# Patient Record
Sex: Female | Born: 1965
Health system: Southern US, Community
[De-identification: ages and names within clinical notes are randomized; demographics above are authoritative.]

## PROBLEM LIST (undated history)

## (undated) DIAGNOSIS — M7061 Trochanteric bursitis, right hip: Secondary | ICD-10-CM

## (undated) DIAGNOSIS — M7062 Trochanteric bursitis, left hip: Secondary | ICD-10-CM

## (undated) DIAGNOSIS — E079 Disorder of thyroid, unspecified: Secondary | ICD-10-CM

## (undated) DIAGNOSIS — M76822 Posterior tibial tendinitis, left leg: Secondary | ICD-10-CM

## (undated) DIAGNOSIS — M722 Plantar fascial fibromatosis: Secondary | ICD-10-CM

## (undated) HISTORY — DX: Disorder of thyroid, unspecified: E07.9

## (undated) HISTORY — DX: Plantar fascial fibromatosis: M72.2

## (undated) HISTORY — DX: Posterior tibial tendinitis, left leg: M76.822

## (undated) HISTORY — PX: APPENDECTOMY: SHX54

## (undated) HISTORY — DX: Trochanteric bursitis, left hip: M70.62

## (undated) HISTORY — DX: Trochanteric bursitis, right hip: M70.61

---

## 2001-12-23 ENCOUNTER — Emergency Department (HOSPITAL_COMMUNITY): Admission: EM | Admit: 2001-12-23 | Discharge: 2001-12-23 | Payer: Self-pay | Admitting: Emergency Medicine

## 2004-12-16 ENCOUNTER — Emergency Department (HOSPITAL_COMMUNITY): Admission: EM | Admit: 2004-12-16 | Discharge: 2004-12-16 | Payer: Self-pay | Admitting: Emergency Medicine

## 2012-12-18 DIAGNOSIS — R5381 Other malaise: Secondary | ICD-10-CM | POA: Insufficient documentation

## 2016-09-02 LAB — HM PAP SMEAR

## 2016-12-16 DIAGNOSIS — H6983 Other specified disorders of Eustachian tube, bilateral: Secondary | ICD-10-CM | POA: Insufficient documentation

## 2017-08-02 ENCOUNTER — Encounter: Payer: Self-pay | Admitting: Sports Medicine

## 2017-08-02 ENCOUNTER — Ambulatory Visit (INDEPENDENT_AMBULATORY_CARE_PROVIDER_SITE_OTHER): Payer: Self-pay | Admitting: Sports Medicine

## 2017-08-02 DIAGNOSIS — M722 Plantar fascial fibromatosis: Secondary | ICD-10-CM | POA: Insufficient documentation

## 2017-08-02 DIAGNOSIS — E669 Obesity, unspecified: Secondary | ICD-10-CM

## 2017-08-02 DIAGNOSIS — M7061 Trochanteric bursitis, right hip: Secondary | ICD-10-CM

## 2017-08-02 DIAGNOSIS — M7062 Trochanteric bursitis, left hip: Secondary | ICD-10-CM

## 2017-08-02 DIAGNOSIS — M76822 Posterior tibial tendinitis, left leg: Secondary | ICD-10-CM

## 2017-08-02 HISTORY — DX: Plantar fascial fibromatosis: M72.2

## 2017-08-02 HISTORY — DX: Posterior tibial tendinitis, left leg: M76.822

## 2017-08-02 HISTORY — DX: Trochanteric bursitis, left hip: M70.62

## 2017-08-02 HISTORY — DX: Trochanteric bursitis, right hip: M70.61

## 2017-08-02 MED ORDER — MELOXICAM 15 MG PO TABS: ORAL_TABLET | ORAL | 3 refills | Status: DC

## 2017-08-02 NOTE — Assessment & Plan Note (Signed)
Discuss weight loss medication with PCP 

## 2017-08-02 NOTE — Progress Notes (Signed)
Subjective:    I'm seeing this patient as a consultation for: Hayden Rasmussenonna Judd, PA-C  CC: Hip pain, foot pain  HPI: Tiffany Best is a pleasant 52 year old female, she comes in with a long history of pain in both hips, left worse than right, lateral aspect.  Worse when laying on the ipsilateral side.  She also has plantar fasciitis diagnosed by another provider, left-sided, as well as tibialis posterior tendinopathy.  Symptoms are moderate, persistent.  I reviewed the past medical history, family history, social history, surgical history, and allergies today and no changes were needed.  Please see the problem list section below in epic for further details.  Past Medical History: No past medical history on file. Past Surgical History: History reviewed. No pertinent surgical history. Social History: Social History   Socioeconomic History  . Marital status: Single    Spouse name: None  . Number of children: None  . Years of education: None  . Highest education level: None  Social Needs  . Financial resource strain: None  . Food insecurity - worry: None  . Food insecurity - inability: None  . Transportation needs - medical: None  . Transportation needs - non-medical: None  Occupational History  . None  Tobacco Use  . Smoking status: None  Substance and Sexual Activity  . Alcohol use: None  . Drug use: None  . Sexual activity: None  Other Topics Concern  . None  Social History Narrative  . None   Family History: No family history on file. Allergies: Allergies  Allergen Reactions  . Penicillins Hives and Rash   Medications: See med rec.  Review of Systems: No headache, visual changes, nausea, vomiting, diarrhea, constipation, dizziness, abdominal pain, skin rash, fevers, chills, night sweats, weight loss, swollen lymph nodes, body aches, joint swelling, muscle aches, chest pain, shortness of breath, mood changes, visual or auditory hallucinations.   Objective:   General: Well  Developed, well nourished, and in no acute distress.  Neuro:  Extra-ocular muscles intact, able to move all 4 extremities, sensation grossly intact.  Deep tendon reflexes tested were normal. Psych: Alert and oriented, mood congruent with affect. ENT:  Ears and nose appear unremarkable.  Hearing grossly normal. Neck: Unremarkable overall appearance, trachea midline.  No visible thyroid enlargement. Eyes: Conjunctivae and lids appear unremarkable.  Pupils equal and round. Skin: Warm and dry, no rashes noted.  Cardiovascular: Pulses palpable, no extremity edema. Left hip: ROM IR: 60 Deg, ER: 60 Deg, Flexion: 120 Deg, Extension: 100 Deg, Abduction: 45 Deg, Adduction: 45 Deg Strength IR: 5/5, ER: 5/5, Flexion: 5/5, Extension: 5/5, Abduction: 5/5, Adduction: 5/5 Pelvic alignment unremarkable to inspection and palpation. Standing hip rotation and gait without trendelenburg / unsteadiness. Greater trochanter with tenderness to palpation. Hip abductor's are weak. No tenderness over piriformis. No SI joint tenderness and normal minimal SI movement.  Impression and Recommendations:   This case required medical decision making of moderate complexity.  Tibialis posterior tendinitis, left Return for custom orthotics, rehab exercises given, meloxicam.  Trochanteric bursitis of both hips Aggressive hip abductor rehabilitation. I would like her to do a single session of formal PT, and then do these exercises at home. Return to see me in a month, injection if no better.  Plantar fasciitis, left Return for custom molded orthotics, additional rehab exercises given. Injection if no better in 1 month.  Obesity (BMI 30-39.9) Discuss weight loss medication with PCP. ___________________________________________ Ihor Austinhomas J. Benjamin Stainhekkekandam, M.D., ABFM., CAQSM. Primary Care and Sports Medicine [facility]  Adjunct Instructor of Family Medicine  [employer]

## 2017-08-02 NOTE — Assessment & Plan Note (Signed)
Return for custom orthotics, rehab exercises given, meloxicam.

## 2017-08-02 NOTE — Assessment & Plan Note (Signed)
Return for custom molded orthotics, additional rehab exercises given. Injection if no better in 1 month.

## 2017-08-02 NOTE — Assessment & Plan Note (Signed)
Aggressive hip abductor rehabilitation. I would like her to do a single session of formal PT, and then do these exercises at home. Return to see me in a month, injection if no better.

## 2017-08-09 ENCOUNTER — Ambulatory Visit: Payer: Self-pay | Admitting: Physician Assistant

## 2017-08-11 ENCOUNTER — Encounter: Payer: Self-pay | Admitting: Sports Medicine

## 2017-09-04 ENCOUNTER — Encounter: Payer: Self-pay | Admitting: Sports Medicine

## 2017-09-14 ENCOUNTER — Ambulatory Visit (INDEPENDENT_AMBULATORY_CARE_PROVIDER_SITE_OTHER): Payer: BLUE CROSS/BLUE SHIELD | Admitting: Sports Medicine

## 2017-09-14 ENCOUNTER — Encounter: Payer: Self-pay | Admitting: Physician Assistant

## 2017-09-14 ENCOUNTER — Ambulatory Visit (INDEPENDENT_AMBULATORY_CARE_PROVIDER_SITE_OTHER): Payer: BLUE CROSS/BLUE SHIELD | Admitting: Physician Assistant

## 2017-09-14 VITALS — BP 140/99 | HR 93 | Ht 60.0 in | Wt 161.0 lb

## 2017-09-14 DIAGNOSIS — R030 Elevated blood-pressure reading, without diagnosis of hypertension: Secondary | ICD-10-CM | POA: Diagnosis not present

## 2017-09-14 DIAGNOSIS — E031 Congenital hypothyroidism without goiter: Secondary | ICD-10-CM | POA: Diagnosis not present

## 2017-09-14 DIAGNOSIS — H1013 Acute atopic conjunctivitis, bilateral: Secondary | ICD-10-CM | POA: Insufficient documentation

## 2017-09-14 DIAGNOSIS — J302 Other seasonal allergic rhinitis: Secondary | ICD-10-CM | POA: Diagnosis not present

## 2017-09-14 DIAGNOSIS — Z13220 Encounter for screening for lipoid disorders: Secondary | ICD-10-CM

## 2017-09-14 DIAGNOSIS — E6609 Other obesity due to excess calories: Secondary | ICD-10-CM | POA: Diagnosis not present

## 2017-09-14 DIAGNOSIS — R5382 Chronic fatigue, unspecified: Secondary | ICD-10-CM | POA: Diagnosis not present

## 2017-09-14 DIAGNOSIS — F17210 Nicotine dependence, cigarettes, uncomplicated: Secondary | ICD-10-CM | POA: Insufficient documentation

## 2017-09-14 DIAGNOSIS — Z7689 Persons encountering health services in other specified circumstances: Secondary | ICD-10-CM | POA: Diagnosis not present

## 2017-09-14 DIAGNOSIS — Z131 Encounter for screening for diabetes mellitus: Secondary | ICD-10-CM

## 2017-09-14 DIAGNOSIS — M722 Plantar fascial fibromatosis: Secondary | ICD-10-CM | POA: Diagnosis not present

## 2017-09-14 DIAGNOSIS — Z130 Encounter for screening for diseases of the blood and blood-forming organs and certain disorders involving the immune mechanism: Secondary | ICD-10-CM | POA: Diagnosis not present

## 2017-09-14 DIAGNOSIS — N951 Menopausal and female climacteric states: Secondary | ICD-10-CM | POA: Insufficient documentation

## 2017-09-14 DIAGNOSIS — Z1231 Encounter for screening mammogram for malignant neoplasm of breast: Secondary | ICD-10-CM | POA: Diagnosis not present

## 2017-09-14 DIAGNOSIS — N926 Irregular menstruation, unspecified: Secondary | ICD-10-CM | POA: Insufficient documentation

## 2017-09-14 DIAGNOSIS — Z1321 Encounter for screening for nutritional disorder: Secondary | ICD-10-CM

## 2017-09-14 DIAGNOSIS — Z713 Dietary counseling and surveillance: Secondary | ICD-10-CM | POA: Insufficient documentation

## 2017-09-14 MED ORDER — OLOPATADINE HCL 0.2 % OP SOLN: 1.0000 [drp] | Freq: Every day | OPHTHALMIC | 5 refills | Status: DC

## 2017-09-14 MED ORDER — LEVOTHYROXINE SODIUM 112 MCG PO TABS: 112.0000 ug | ORAL_TABLET | Freq: Every day | ORAL | 5 refills | Status: DC

## 2017-09-14 MED ORDER — LEVOCETIRIZINE DIHYDROCHLORIDE 5 MG PO TABS: 5.0000 mg | ORAL_TABLET | Freq: Every evening | ORAL | 5 refills | Status: DC

## 2017-09-14 MED ORDER — FLUTICASONE PROPIONATE 50 MCG/ACT NA SUSP: 1.0000 | Freq: Every day | NASAL | 3 refills | Status: DC

## 2017-09-14 MED ORDER — BUPROPION HCL ER (XL) 150 MG PO TB24: 150.0000 mg | ORAL_TABLET | Freq: Every day | ORAL | 2 refills | Status: DC

## 2017-09-14 NOTE — Patient Instructions (Addendum)
WEIGHT LOSS PLAN Exercise goals: ride stationary bike for 15 minutes at least 3 days per week Nutrition goals: increase my water intake, increase fresh fruits and vegetables in my diet Health goals: improve my overall energy and vitality - start Wellbutrin. Take in the morning daily - 1250 calories/day to lose 1/2 pound per week, 1000 calories to lose 1 pound per week - 30-35% calories from protein - 30% calories from fat - 35-40% from carbs: If diabetic, limit carbs to 30 g per meal and 15 g per snack - MEASURE your calories (MyFitnessPal, LoseItapp of some sort) - 8 glasses of water per day - limit caffeine to 1 unsweetened beverage per day - avoid fried foods, saturated fat, and heavily processed foods - keep sugar as low as possible  - follow DASH or Mediterranean diets for recipes - avoid skipping meals. Eat 3 regular meals per day with 1-2 snacks (stay within your calorie goal) OR graze every 4 hours. The important thing is to stay on a schedule - avoid eating within 2 hours of bedtime

## 2017-09-14 NOTE — Progress Notes (Signed)
HPI:                                                                Tiffany Best is a 52 y.o. female who presents to [facility]: Primary Care Sports Medicine today to establish care  Current concerns: medication refills, allergies, weight gain  Hypothyroidism: diagnosed age 56. Denies hx of hashimoto's or goiter. Still has thyroid. Denies nodules, enlargement, or compressive symptoms. Has been taking Synthroid 112 mcg for years. Needs TSH checked today.  Weight gain: reports ~20 pound weight gain over the last 6 months related to inactivity from joint pain. Goal weight is 140, size 6-8. Current weight is her highest adult body weight: 161 Lb Nutrition: skips breakfast and lunch, drinks 2 cups of coffee/day w/sweetener, drinks 2-3 alcoholic beverages per week She is interested in low-carb diet or keto diet Declines referral to nutritionist  Prior weight loss attempts: calorie restriction and physical activity Exercise: stationary bike x 20 mins 1-3 times per week Barriers: stress, musculoskeletal issues, feels like she can only walk for 15 minutes without stopping due to pain Co-morbidities: thyroid disease  Allergies: for the last 3 days has had itchy, watery, red eyes, sneezing and scratchy throat. No fever, chills, malaise, cough. Hx of seasonal allergies. Currently taking Mucinex.  Depression screen [facility] 2/9 09/14/2017  Decreased Interest 0  Down, Depressed, Hopeless 0  PHQ - 2 Score 0    No flowsheet data found.    Past Medical History:  Diagnosis Date  . Plantar fasciitis, left 08/02/2017  . Thyroid disease   . Tibialis posterior tendinitis, left 08/02/2017  . Trochanteric bursitis of both hips 08/02/2017   Past Surgical History:  Procedure Laterality Date  . APPENDECTOMY     Social History   Tobacco Use  . Smoking status: Never Smoker  . Smokeless tobacco: Never Used  Substance Use Topics  . Alcohol use: Not on file   family history is not on  file.    ROS: negative except as noted in the HPI  Medications: Current Outpatient Medications  Medication Sig Dispense Refill  . levothyroxine (SYNTHROID, LEVOTHROID) 112 MCG tablet TAKE 1 TABLET BY MOUTH EVERY DAY AT 6AM  0   No current facility-administered medications for this visit.    Allergies  Allergen Reactions  . Penicillins Hives and Rash       Objective:  BP (!) 139/101   Pulse 92   Ht 5' (1.524 m)   Wt 161 lb (73 kg)   BMI 31.44 kg/m  Gen:  alert, not ill-appearing, no distress, appropriate for age, obese female HEENT: head normocephalic without obvious abnormality, conjunctiva and cornea clear, trachea midline Pulm: Normal work of breathing, normal phonation, clear to auscultation bilaterally, no wheezes, rales or rhonchi CV: Normal rate, regular rhythm, s1 and s2 distinct, no murmurs, clicks or rubs  Neuro: alert and oriented x 3, no tremor MSK: extremities atraumatic, normal gait and station Skin: intact, no rashes on exposed skin, no jaundice, no cyanosis Psych: well-groomed, cooperative, good eye contact, euthymic mood, affect mood-congruent, speech is articulate, and thought processes clear and goal-directed    No results found for this or any previous visit (from the past 72 hour(s)). No results found.    Assessment and Plan:  52 y.o. female with   1. Encounter to establish care - reviewed PMH, PSH, PFH, meds, allergies, HM - overdue for colon cancer screening, mammogram and Pap smear - last mammo 11/02/14 - last Pap unknown  2. Class 1 obesity due to excess calories in adult, unspecified BMI, unspecified whether serious comorbidity present Wt Readings from Last 3 Encounters:  09/14/17 161 lb (73 kg)  09/14/17 161 lb (73 kg)  08/02/17 160 lb (72.6 kg)  - patient interested in weight loss medication. She likely would not qualify for new agents due to BMI 31 and no comorbidity. Recommend we use Wellbutrin off-label since she also has  long-standing fatigue/sluggish mood - counseled on weight loss through decreased caloric intake and increased aerobic exercise - 1250 calorie moderate protein diet - declines referral to nutritionist - buPROPion (WELLBUTRIN XL) 150 MG 24 hr tablet; Take 1 tablet (150 mg total) by mouth daily.  Dispense: 30 tablet; Refill: 2  3. Elevated blood pressure reading BP Readings from Last 3 Encounters:  09/14/17 (!) 140/99  09/14/17 (!) 140/99  08/02/17 (!) 152/101  - denies hx of HTN. Asymptomatic. Reports she gets nervous in doctor's office - CVD risk factors: obesity, tobacco use - counseled to stop oral decongestants - therapeutic lifestyle changes - close follow-up in 1 month  4. Congenital hypothyroidism without goiter - last TSH 12/16/16 1.09 ([facility]) - TSH + free T4 - levothyroxine (SYNTHROID, LEVOTHROID) 112 MCG tablet; Take 1 tablet (112 mcg total) by mouth daily before breakfast.  Dispense: 30 tablet; Refill: 5  5. Chronic fatigue - CBC - Comprehensive metabolic panel - Ferritin - Sedimentation rate - Vitamin B12 - Vit D  25 hydroxy (rtn osteoporosis monitoring) - TSH + free T4 - Lipid Panel w/reflex Direct LDL - buPROPion (WELLBUTRIN XL) 150 MG 24 hr tablet; Take 1 tablet (150 mg total) by mouth daily.  Dispense: 30 tablet; Refill: 2  6. Encounter for vitamin deficiency screening - Vitamin B12 - Vit D  25 hydroxy (rtn osteoporosis monitoring)  7. Screening for lipid disorders - Lipid Panel w/reflex Direct LDL  8. Screening for diabetes mellitus (DM) - Comprehensive metabolic panel  9. Screening for blood disease - CBC - Comprehensive metabolic panel - Ferritin  10. Perimenopausal   11. Allergic conjunctivitis of both eyes - Olopatadine HCl 0.2 % SOLN; Apply 1 drop to eye daily.  Dispense: 2.5 mL; Refill: 5  12. Seasonal allergic rhinitis, unspecified trigger - levocetirizine (XYZAL) 5 MG tablet; Take 1 tablet (5 mg total) by mouth every evening.  Dispense:  30 tablet; Refill: 5 - fluticasone (FLONASE) 50 MCG/ACT nasal spray; Place 1 spray into both nostrils daily.  Dispense: 16 g; Refill: 3     Patient education and anticipatory guidance given Patient agrees with treatment plan Follow-up in 4 weeks for BP and weight check or sooner as needed if symptoms worsen or fail to improve  Levonne Hubertharley E. Dolphus Linch PA-C

## 2017-09-14 NOTE — Assessment & Plan Note (Signed)
Set of custom orthotics as above. Return in 1 month.

## 2017-09-14 NOTE — Progress Notes (Signed)
    Patient was fitted for a : standard, cushioned, semi-rigid orthotic. The orthotic was heated and afterward the patient stood on the orthotic blank positioned on the orthotic stand. The patient was positioned in subtalar neutral position and 10 degrees of ankle dorsiflexion in a weight bearing stance. After completion of molding, a stable base was applied to the orthotic blank. The blank was ground to a stable position for weight bearing. Size: 6 Base: White EVA Additional Posting and Padding: None The patient ambulated these, and they were very comfortable.  I spent 40 minutes with this patient, greater than 50% was face-to-face time counseling regarding the below diagnosis.  ___________________________________________ Thomas J. Thekkekandam, M.D., ABFM., CAQSM. Primary Care and Sports Medicine [facility]  Adjunct Instructor of Family Medicine  [employer]   

## 2017-09-15 LAB — LIPID PANEL W/REFLEX DIRECT LDL
CHOL/HDL RATIO: 2.4 (calc) (ref <5.0)
Cholesterol: 270 mg/dL — ABNORMAL HIGH (ref <200)
HDL: 113 mg/dL (ref >50)
LDL CHOLESTEROL (CALC): 141 mg/dL — AB
Non-HDL Cholesterol (Calc): 157 mg/dL (calc) — ABNORMAL HIGH (ref <130)
Triglycerides: 64 mg/dL (ref <150)

## 2017-09-15 LAB — TSH+FREE T4: TSH W/REFLEX TO FT4: 0.39 mIU/L — ABNORMAL LOW

## 2017-09-15 LAB — COMPREHENSIVE METABOLIC PANEL
AG Ratio: 1.6 (calc) (ref 1.0–2.5)
ALKALINE PHOSPHATASE (APISO): 54 U/L (ref 33–130)
ALT: 25 U/L (ref 6–29)
AST: 17 U/L (ref 10–35)
Albumin: 4.8 g/dL (ref 3.6–5.1)
BUN: 9 mg/dL (ref 7–25)
CHLORIDE: 104 mmol/L (ref 98–110)
CO2: 26 mmol/L (ref 20–32)
CREATININE: 0.65 mg/dL (ref 0.50–1.05)
Calcium: 10.4 mg/dL (ref 8.6–10.4)
GLOBULIN: 3 g/dL (ref 1.9–3.7)
GLUCOSE: 95 mg/dL (ref 65–99)
Potassium: 4.3 mmol/L (ref 3.5–5.3)
Sodium: 141 mmol/L (ref 135–146)
Total Bilirubin: 0.4 mg/dL (ref 0.2–1.2)
Total Protein: 7.8 g/dL (ref 6.1–8.1)

## 2017-09-15 LAB — CBC
HCT: 43.2 % (ref 35.0–45.0)
Hemoglobin: 15 g/dL (ref 11.7–15.5)
MCH: 33.4 pg — AB (ref 27.0–33.0)
MCHC: 34.7 g/dL (ref 32.0–36.0)
MCV: 96.2 fL (ref 80.0–100.0)
MPV: 10.3 fL (ref 7.5–12.5)
PLATELETS: 348 10*3/uL (ref 140–400)
RBC: 4.49 10*6/uL (ref 3.80–5.10)
RDW: 13 % (ref 11.0–15.0)
WBC: 9.2 10*3/uL (ref 3.8–10.8)

## 2017-09-15 LAB — T4, FREE: Free T4: 1.7 ng/dL (ref 0.8–1.8)

## 2017-09-15 LAB — VITAMIN B12: VITAMIN B 12: 1748 pg/mL — AB (ref 200–1100)

## 2017-09-15 LAB — VITAMIN D 25 HYDROXY (VIT D DEFICIENCY, FRACTURES): Vit D, 25-Hydroxy: 37 ng/mL (ref 30–100)

## 2017-09-15 LAB — FERRITIN: FERRITIN: 45 ng/mL (ref 10–232)

## 2017-09-15 LAB — SEDIMENTATION RATE: SED RATE: 6 mm/h (ref 0–20)

## 2017-09-19 ENCOUNTER — Encounter: Payer: Self-pay | Admitting: Physician Assistant

## 2017-09-19 ENCOUNTER — Other Ambulatory Visit: Payer: Self-pay | Admitting: Physician Assistant

## 2017-09-19 DIAGNOSIS — E031 Congenital hypothyroidism without goiter: Secondary | ICD-10-CM

## 2017-09-19 DIAGNOSIS — E782 Mixed hyperlipidemia: Secondary | ICD-10-CM | POA: Insufficient documentation

## 2017-09-19 MED ORDER — LEVOTHYROXINE SODIUM 100 MCG PO TABS: 100.0000 ug | ORAL_TABLET | Freq: Every day | ORAL | 5 refills | Status: DC

## 2017-09-19 NOTE — Progress Notes (Signed)
Hi Tiffany Best,  Your labs show that your Levothyroxine dose is a bit too high. I would recommend we take you from 112 to 100 mcg daily. I will send a new prescription.  Your total and LDL cholesterol are high. Given that smoking is also a risk factor for heart disease for you, I would recommend cholesterol-lowering medication in addition to low-fat diet and regular aerobic exercise.  Nice meeting you!   [place]arm[place], Safeco CorporationCharley

## 2017-09-26 ENCOUNTER — Telehealth: Payer: Self-pay

## 2017-09-26 NOTE — Telephone Encounter (Signed)
Dr. Murrell ConverseYoon called and would like to ask you some questions about pt left foot pain and see if she has any restrictions. Please assist.

## 2017-09-26 NOTE — Telephone Encounter (Signed)
Who is Dr. Murrell ConverseYoon?  And no, she can do activities as tolerated.

## 2017-09-27 NOTE — Telephone Encounter (Signed)
Dr. Scherrie Bateman is with [employer]. Called her and informed her of information.

## 2017-10-16 ENCOUNTER — Ambulatory Visit: Payer: BLUE CROSS/BLUE SHIELD | Admitting: Physician Assistant

## 2017-10-22 ENCOUNTER — Other Ambulatory Visit: Payer: Self-pay | Admitting: Physician Assistant

## 2017-10-22 DIAGNOSIS — E6609 Other obesity due to excess calories: Secondary | ICD-10-CM

## 2017-10-22 DIAGNOSIS — R5382 Chronic fatigue, unspecified: Secondary | ICD-10-CM

## 2018-02-15 ENCOUNTER — Other Ambulatory Visit: Payer: Self-pay | Admitting: Physician Assistant

## 2018-02-15 DIAGNOSIS — E031 Congenital hypothyroidism without goiter: Secondary | ICD-10-CM

## 2018-02-15 DIAGNOSIS — J302 Other seasonal allergic rhinitis: Secondary | ICD-10-CM

## 2018-09-03 ENCOUNTER — Other Ambulatory Visit: Payer: Self-pay | Admitting: Physician Assistant

## 2018-09-03 DIAGNOSIS — J302 Other seasonal allergic rhinitis: Secondary | ICD-10-CM

## 2018-10-08 ENCOUNTER — Telehealth: Payer: Self-pay | Admitting: Physician Assistant

## 2018-10-08 DIAGNOSIS — E031 Congenital hypothyroidism without goiter: Secondary | ICD-10-CM

## 2018-10-08 DIAGNOSIS — E782 Mixed hyperlipidemia: Secondary | ICD-10-CM

## 2018-10-08 DIAGNOSIS — Z5181 Encounter for therapeutic drug level monitoring: Secondary | ICD-10-CM

## 2018-10-08 DIAGNOSIS — E6609 Other obesity due to excess calories: Secondary | ICD-10-CM

## 2018-10-08 NOTE — Telephone Encounter (Signed)
Pt is now scheduled.

## 2018-10-08 NOTE — Telephone Encounter (Signed)
Please contact patient to schedule e-visit for medication management She is also due for yearly fasting labs including thyroid

## 2018-10-09 ENCOUNTER — Encounter: Payer: Self-pay | Admitting: Physician Assistant

## 2018-10-09 ENCOUNTER — Ambulatory Visit (INDEPENDENT_AMBULATORY_CARE_PROVIDER_SITE_OTHER): Payer: BLUE CROSS/BLUE SHIELD | Admitting: Physician Assistant

## 2018-10-09 VITALS — Temp 97.1°F | Wt 185.0 lb

## 2018-10-09 DIAGNOSIS — J302 Other seasonal allergic rhinitis: Secondary | ICD-10-CM

## 2018-10-09 DIAGNOSIS — E782 Mixed hyperlipidemia: Secondary | ICD-10-CM | POA: Diagnosis not present

## 2018-10-09 DIAGNOSIS — F331 Major depressive disorder, recurrent, moderate: Secondary | ICD-10-CM

## 2018-10-09 DIAGNOSIS — R635 Abnormal weight gain: Secondary | ICD-10-CM | POA: Insufficient documentation

## 2018-10-09 DIAGNOSIS — M7061 Trochanteric bursitis, right hip: Secondary | ICD-10-CM

## 2018-10-09 DIAGNOSIS — M7062 Trochanteric bursitis, left hip: Secondary | ICD-10-CM

## 2018-10-09 DIAGNOSIS — Z9181 History of falling: Secondary | ICD-10-CM

## 2018-10-09 DIAGNOSIS — R5382 Chronic fatigue, unspecified: Secondary | ICD-10-CM

## 2018-10-09 DIAGNOSIS — M2550 Pain in unspecified joint: Secondary | ICD-10-CM

## 2018-10-09 DIAGNOSIS — G894 Chronic pain syndrome: Secondary | ICD-10-CM

## 2018-10-09 DIAGNOSIS — K5904 Chronic idiopathic constipation: Secondary | ICD-10-CM

## 2018-10-09 DIAGNOSIS — Z79899 Other long term (current) drug therapy: Secondary | ICD-10-CM | POA: Diagnosis not present

## 2018-10-09 DIAGNOSIS — M25562 Pain in left knee: Secondary | ICD-10-CM

## 2018-10-09 DIAGNOSIS — E031 Congenital hypothyroidism without goiter: Secondary | ICD-10-CM | POA: Diagnosis not present

## 2018-10-09 DIAGNOSIS — Z131 Encounter for screening for diabetes mellitus: Secondary | ICD-10-CM

## 2018-10-09 MED ORDER — LEVOTHYROXINE SODIUM 100 MCG PO TABS: 100.0000 ug | ORAL_TABLET | Freq: Every day | ORAL | 0 refills | Status: DC

## 2018-10-09 MED ORDER — LUBIPROSTONE 24 MCG PO CAPS: 24.0000 ug | ORAL_CAPSULE | Freq: Two times a day (BID) | ORAL | 0 refills | Status: DC | PRN

## 2018-10-09 MED ORDER — CELECOXIB 100 MG PO CAPS: 100.0000 mg | ORAL_CAPSULE | Freq: Two times a day (BID) | ORAL | 3 refills | Status: DC | PRN

## 2018-10-09 MED ORDER — LEVOCETIRIZINE DIHYDROCHLORIDE 5 MG PO TABS: 5.0000 mg | ORAL_TABLET | Freq: Every evening | ORAL | 3 refills | Status: DC

## 2018-10-09 MED ORDER — TRAMADOL HCL 50 MG PO TABS: 50.0000 mg | ORAL_TABLET | Freq: Four times a day (QID) | ORAL | 0 refills | Status: DC | PRN

## 2018-10-09 MED ORDER — BUPROPION HCL 75 MG PO TABS: 75.0000 mg | ORAL_TABLET | ORAL | 2 refills | Status: DC

## 2018-10-09 NOTE — Progress Notes (Signed)
Virtual Visit via Video Note  I connected with Tiffany Best on 10/09/18 at 11:10 AM EDT by a video enabled telemedicine application and verified that I am speaking with the correct person using two identifiers.   I discussed the limitations of evaluation and management by telemedicine and the availability of in person appointments. The patient expressed understanding and agreed to proceed.  History of Present Illness: HPI:                                                                Tiffany Best is a 53 y.o. female   CC: multiple concerns  Hypothyroidism: diagnosed age 84. Denies hx of hashimoto's or goiter. Still has thyroid. Denies nodules, enlargement, or compressive symptoms. Dose of Levothyroxine was decreased to 100 mcg 1 year ago, TSH was over-suppressed at 0.39.  Depression/Anxiety: self-discontinued her Wellbutrin; "did not like how it made her feel." reports she is feeling very down due to chronic pain from trochanteric bursitis and plantar fasciitis. She states she has not left the house since December. Denies symptoms of mania/hypomania. Denies suicidal thinking. Denies auditory/visual hallucinations.  Weight gain: reports 20 lb weight gain over the last year. She is very upset by this.  Reports a nodule on her anterior left knee that developed 6 months ago. Nodule is firm, non-tender but irritates her with standing/flexing her knee. No known injury or trauma. Reports hx of repetitive kneeling on her knees for work. Reports she is afraid to shower because she is afraid of falling and feels unsteady on her feet. She has been applying a topical ginger root supplement for about 1 week. No family hx of RA  Has been in process of applying for disability since May 2018. She lives at home with partner. She is unable to drive or work.  Allergies: reports left-side of her neck "feels clogged." Endorses bilateral ear pressure.   Constipation: reports she does not feel she can have a  complete bowel movement. Has been taking OTC medications without relief. Denies abdominal pain or blood in stool.  Depression screen [facility] 2/9 10/09/2018 09/14/2017  Decreased Interest 1 0  Down, Depressed, Hopeless 3 0  PHQ - 2 Score 4 0  Altered sleeping 2 -  Tired, decreased energy 3 -  Change in appetite 1 -  Feeling bad or failure about yourself  1 -  Trouble concentrating 0 -  Moving slowly or fidgety/restless 1 -  Suicidal thoughts 0 -  PHQ-9 Score 12 -    GAD 7 : Generalized Anxiety Score 10/09/2018  Nervous, Anxious, on Edge 3  Control/stop worrying 2  Worry too much - different things 1  Trouble relaxing 1  Restless 1  Easily annoyed or irritable 1  Afraid - awful might happen 3  Total GAD 7 Score 12      Past Medical History:  Diagnosis Date  . Plantar fasciitis, left 08/02/2017  . Thyroid disease   . Tibialis posterior tendinitis, left 08/02/2017  . Trochanteric bursitis of both hips 08/02/2017   Past Surgical History:  Procedure Laterality Date  . APPENDECTOMY     Social History   Tobacco Use  . Smoking status: Current Every Day Smoker    Years: 15.00    Types: Cigarettes  . Smokeless tobacco:  Never Used  Substance Use Topics  . Alcohol use: Yes    Alcohol/week: 2.0 - 3.0 standard drinks    Types: 2 - 3 Standard drinks or equivalent per week   Family history is unknown by patient.    Review of Systems  Constitutional: Positive for fatigue and unexpected weight change (gain).  HENT: Positive for rhinorrhea, sinus pressure and sneezing. Negative for sore throat and trouble swallowing.   Eyes: Positive for itching.  Respiratory: Negative for cough, chest tightness and shortness of breath.   Cardiovascular: Negative for chest pain.  Gastrointestinal: Positive for abdominal distention and constipation. Negative for abdominal pain, blood in stool, nausea and vomiting.  Endocrine: Positive for polyphagia.  Musculoskeletal: Positive for arthralgias  (bilateral hips, L knee), back pain (R, LBP) and gait problem.       L foot pain  Psychiatric/Behavioral: Positive for dysphoric mood and sleep disturbance. Negative for self-injury and suicidal ideas. The patient is nervous/anxious.   All other systems reviewed and are negative.    Medications: Current Outpatient Medications  Medication Sig Dispense Refill  . fluticasone (FLONASE) 50 MCG/ACT nasal spray Place 1 spray into both nostrils daily. 16 g 3  . levocetirizine (XYZAL) 5 MG tablet TAKE 1 TABLET BY MOUTH EVERY DAY IN THE EVENING 90 tablet 0  . levothyroxine (SYNTHROID, LEVOTHROID) 100 MCG tablet TAKE 1 TABLET (100 MCG TOTAL) BY MOUTH DAILY BEFORE BREAKFAST. 90 tablet 1  . buPROPion (WELLBUTRIN XL) 150 MG 24 hr tablet TAKE 1 TABLET BY MOUTH EVERY DAY (Patient not taking: Reported on 10/09/2018) 90 tablet 0  . Olopatadine HCl 0.2 % SOLN Apply 1 drop to eye daily. 2.5 mL 5   No current facility-administered medications for this visit.    Allergies  Allergen Reactions  . Penicillins Hives and Rash       Objective:  Temp (!) 97.1 F (36.2 C) (Oral)   Wt 185 lb (83.9 kg)   BMI 36.13 kg/m  Gen:  alert, not ill-appearing, no distress, appropriate for age HEENT: head normocephalic without obvious abnormality, conjunctiva and cornea clear, trachea midline Pulm: Normal work of breathing, normal phonation Neuro: alert and oriented x 3 Psych: cooperative, depressed mood, affect mood-congruent, she is tearful, speech is articulate, normal rate and volume; thought processes clear and goal-directed, normal judgment, good insight   BP Readings from Last 3 Encounters:  09/14/17 (!) 140/99  09/14/17 (!) 140/99  08/02/17 (!) 152/101   Wt Readings from Last 3 Encounters:  10/09/18 185 lb (83.9 kg)  09/14/17 161 lb (73 kg)  09/14/17 161 lb (73 kg)     Lab Results  Component Value Date   CREATININE 0.65 09/14/2017   BUN 9 09/14/2017   NA 141 09/14/2017   K 4.3 09/14/2017   CL  104 09/14/2017   CO2 26 09/14/2017   Lab Results  Component Value Date   ALT 25 09/14/2017   AST 17 09/14/2017   BILITOT 0.4 09/14/2017      Assessment and Plan: 53 y.o. female with   .Tiffany Best was seen today for medication management.  Diagnoses and all orders for this visit:  Encounter for long-term (current) use of medications -     C-reactive protein -     Sedimentation rate -     Rheumatoid factor -     ANA -     CBC with Differential/Platelet -     COMPLETE METABOLIC PANEL WITH GFR -     Hemoglobin A1c -  Lipid Panel w/reflex Direct LDL -     TSH -     Urinalysis, Routine w reflex microscopic  Mixed hyperlipidemia -     Lipid Panel w/reflex Direct LDL  Congenital hypothyroidism without goiter -     levothyroxine (SYNTHROID) 100 MCG tablet; Take 1 tablet (100 mcg total) by mouth daily before breakfast. -     TSH  Class 2 severe obesity due to excess calories with serious comorbidity in adult, unspecified BMI (HCC) -     Hemoglobin A1c -     Lipid Panel w/reflex Direct LDL  Abnormal weight gain -     Hemoglobin A1c  Seasonal allergic rhinitis, unspecified trigger -     levocetirizine (XYZAL) 5 MG tablet; Take 1 tablet (5 mg total) by mouth every evening.  Chronic pain syndrome -     Ambulatory referral to Home Health -     celecoxib (CELEBREX) 100 MG capsule; Take 1 capsule (100 mg total) by mouth 2 (two) times daily as needed for moderate pain. -     traMADol (ULTRAM) 50 MG tablet; Take 1 tablet (50 mg total) by mouth every 6 (six) hours as needed for severe pain. Maximum 6 tabs/day -     C-reactive protein -     Sedimentation rate -     Rheumatoid factor -     ANA -     CBC with Differential/Platelet -     COMPLETE METABOLIC PANEL WITH GFR -     Urinalysis, Routine w reflex microscopic  Left anterior knee pain -     DG Knee Complete 4 Views Left -     Ambulatory referral to Home Health -     traMADol (ULTRAM) 50 MG tablet; Take 1 tablet (50 mg  total) by mouth every 6 (six) hours as needed for severe pain. Maximum 6 tabs/day  At moderate risk for fall -     Ambulatory referral to Home Health  Trochanteric bursitis of both hips -     Ambulatory referral to Home Health -     celecoxib (CELEBREX) 100 MG capsule; Take 1 capsule (100 mg total) by mouth 2 (two) times daily as needed for moderate pain. -     traMADol (ULTRAM) 50 MG tablet; Take 1 tablet (50 mg total) by mouth every 6 (six) hours as needed for severe pain. Maximum 6 tabs/day  Seasonal allergies  Chronic fatigue -     buPROPion (WELLBUTRIN) 75 MG tablet; Take 1 tablet (75 mg total) by mouth every morning. -     C-reactive protein -     Sedimentation rate -     Rheumatoid factor -     ANA -     CBC with Differential/Platelet -     COMPLETE METABOLIC PANEL WITH GFR -     TSH -     Urinalysis, Routine w reflex microscopic  Chronic idiopathic constipation -     Discontinue: lubiprostone (AMITIZA) 24 MCG capsule; Take 1 capsule (24 mcg total) by mouth 2 (two) times daily as needed for constipation. With a meal  Moderate episode of recurrent major depressive disorder (HCC) -     buPROPion (WELLBUTRIN) 75 MG tablet; Take 1 tablet (75 mg total) by mouth every morning.  Screening for diabetes mellitus -     Hemoglobin A1c  Arthralgia of multiple sites -     C-reactive protein -     Sedimentation rate -     Rheumatoid factor -  ANA -     CBC with Differential/Platelet -     COMPLETE METABOLIC PANEL WITH GFR -     Urinalysis, Routine w reflex microscopic   Rheumatologic labs pending for polyarthralgias / chronic pain Left knee x-ray pending Start Celebrex bid  Tramadol prn for breakthrough pain Home health PT (patient unable to leave home for PT)  Depression PHQ9=12, no acute safety issues Complicated by chronic pain/disability/unemployment Re-trial of Wellbutrin at reduced dose of 75 mg  Follow-up in 1 month or sooner as needed  Follow Up  Instructions:    I discussed the assessment and treatment plan with the patient. The patient was provided an opportunity to ask questions and all were answered. The patient agreed with the plan and demonstrated an understanding of the instructions.   The patient was advised to call back or seek an in-person evaluation if the symptoms worsen or if the condition fails to improve as anticipated.  I provided 40 minutes of non-face-to-face time during this encounter.   Carlis Stable, [place]

## 2018-10-10 ENCOUNTER — Telehealth: Payer: Self-pay | Admitting: Physician Assistant

## 2018-10-10 DIAGNOSIS — E669 Obesity, unspecified: Secondary | ICD-10-CM

## 2018-10-10 DIAGNOSIS — R29898 Other symptoms and signs involving the musculoskeletal system: Secondary | ICD-10-CM

## 2018-10-10 DIAGNOSIS — K5904 Chronic idiopathic constipation: Secondary | ICD-10-CM

## 2018-10-10 MED ORDER — NONFORMULARY OR COMPOUNDED ITEM: 0 refills | Status: DC

## 2018-10-10 MED ORDER — PLECANATIDE 3 MG PO TABS: 3.0000 mg | ORAL_TABLET | Freq: Every day | ORAL | 0 refills | Status: DC

## 2018-10-10 NOTE — Telephone Encounter (Signed)
I called the patient to let her know I am working on the PA and she wants to know if she can get an order for a bath chair and possibly a hand bar. She was thinking on it over night and she is wants to do this for her safety. Please advise.

## 2018-10-10 NOTE — Telephone Encounter (Signed)
Patient is aware of the medication change 

## 2018-10-10 NOTE — Telephone Encounter (Signed)
The paper prescription is in your box waiting on a signature.

## 2018-10-10 NOTE — Telephone Encounter (Signed)
Please fax to [facility] Please let patient know alternatively she can purchase these at [facility]

## 2018-10-10 NOTE — Telephone Encounter (Signed)
I was working on the PA for Amitiza and there was a box popped up that stated Trulanace and [employer]proic were the lower cost options. Do you want me to work on PA for Amitiza? Please advise.

## 2018-10-10 NOTE — Telephone Encounter (Signed)
Changed to [employer] Please keep patient informed of PA status and send discount coupon via MyChart Thanks!

## 2018-10-11 NOTE — Telephone Encounter (Signed)
Signed. Routing to [employer]

## 2018-10-12 NOTE — Telephone Encounter (Signed)
Approved today ([employer]ulance) Effective from 10/12/2018 through 10/11/2019.Pharmacy aware.

## 2018-10-15 ENCOUNTER — Ambulatory Visit: Payer: BLUE CROSS/BLUE SHIELD | Admitting: Physician Assistant

## 2018-10-15 DIAGNOSIS — F331 Major depressive disorder, recurrent, moderate: Secondary | ICD-10-CM | POA: Insufficient documentation

## 2018-10-15 DIAGNOSIS — M2550 Pain in unspecified joint: Secondary | ICD-10-CM | POA: Insufficient documentation

## 2018-10-15 DIAGNOSIS — K5904 Chronic idiopathic constipation: Secondary | ICD-10-CM | POA: Insufficient documentation

## 2018-10-15 DIAGNOSIS — M25562 Pain in left knee: Secondary | ICD-10-CM | POA: Insufficient documentation

## 2018-10-15 DIAGNOSIS — G894 Chronic pain syndrome: Secondary | ICD-10-CM | POA: Insufficient documentation

## 2018-10-15 DIAGNOSIS — Z9181 History of falling: Secondary | ICD-10-CM | POA: Insufficient documentation

## 2018-10-24 ENCOUNTER — Other Ambulatory Visit: Payer: Self-pay | Admitting: Physician Assistant

## 2018-10-24 DIAGNOSIS — E031 Congenital hypothyroidism without goiter: Secondary | ICD-10-CM

## 2018-11-02 ENCOUNTER — Other Ambulatory Visit: Payer: Self-pay | Admitting: Physician Assistant

## 2018-11-02 ENCOUNTER — Telehealth: Payer: Self-pay | Admitting: Physician Assistant

## 2018-11-02 DIAGNOSIS — F331 Major depressive disorder, recurrent, moderate: Secondary | ICD-10-CM

## 2018-11-02 DIAGNOSIS — R5382 Chronic fatigue, unspecified: Secondary | ICD-10-CM

## 2018-11-02 NOTE — Telephone Encounter (Signed)
Patient called today and states she has left messages last week and this week for Evoni on her Voicemail. She needs Evoni or Vinetta Bergamo to call her back as soon as possible. She states one of the issues is that [place] placed a order for a shower chair and something else on April 28th and she has not heard anything from anyone.

## 2018-11-06 ENCOUNTER — Other Ambulatory Visit: Payer: Self-pay

## 2018-11-06 DIAGNOSIS — R29898 Other symptoms and signs involving the musculoskeletal system: Secondary | ICD-10-CM

## 2018-11-06 MED ORDER — NONFORMULARY OR COMPOUNDED ITEM: 0 refills | Status: AC

## 2018-11-06 NOTE — Telephone Encounter (Signed)
Evoni instructed to re-fax DME orders and contact patient to provide follow-up

## 2018-11-06 NOTE — Telephone Encounter (Signed)
I called pt and left a VM for her to schedule a follow up appt with Gena Fray as directed by Vinetta Bergamo

## 2018-11-08 ENCOUNTER — Telehealth: Payer: Self-pay | Admitting: Physician Assistant

## 2018-11-08 NOTE — Telephone Encounter (Signed)
Left vm for pt to return call to clinic -EH/RMA  

## 2018-11-08 NOTE — Telephone Encounter (Signed)
Pt called. She still has not heard anything on her shower chair and 2 rails. I told pt that we left vm on update but she said she never got the vm.  Thanks.

## 2018-11-09 NOTE — Telephone Encounter (Signed)
Orders faxed to [phone]. -EH/RMA

## 2018-11-09 NOTE — Telephone Encounter (Signed)
Thank you :)

## 2018-11-12 ENCOUNTER — Other Ambulatory Visit: Payer: Self-pay | Admitting: Physician Assistant

## 2018-11-12 DIAGNOSIS — E031 Congenital hypothyroidism without goiter: Secondary | ICD-10-CM

## 2018-12-13 ENCOUNTER — Other Ambulatory Visit: Payer: Self-pay | Admitting: Physician Assistant

## 2018-12-13 DIAGNOSIS — M7061 Trochanteric bursitis, right hip: Secondary | ICD-10-CM

## 2018-12-13 DIAGNOSIS — M25562 Pain in left knee: Secondary | ICD-10-CM

## 2018-12-13 DIAGNOSIS — F331 Major depressive disorder, recurrent, moderate: Secondary | ICD-10-CM

## 2018-12-13 DIAGNOSIS — E031 Congenital hypothyroidism without goiter: Secondary | ICD-10-CM

## 2018-12-13 DIAGNOSIS — G894 Chronic pain syndrome: Secondary | ICD-10-CM

## 2018-12-13 DIAGNOSIS — R5382 Chronic fatigue, unspecified: Secondary | ICD-10-CM

## 2018-12-13 DIAGNOSIS — M7062 Trochanteric bursitis, left hip: Secondary | ICD-10-CM

## 2018-12-19 ENCOUNTER — Other Ambulatory Visit: Payer: Self-pay | Admitting: Physician Assistant

## 2018-12-19 DIAGNOSIS — E031 Congenital hypothyroidism without goiter: Secondary | ICD-10-CM

## 2018-12-19 MED ORDER — LEVOTHYROXINE SODIUM 100 MCG PO TABS: 100.0000 ug | ORAL_TABLET | Freq: Every day | ORAL | 0 refills | Status: DC

## 2019-01-10 ENCOUNTER — Other Ambulatory Visit: Payer: Self-pay | Admitting: Physician Assistant

## 2019-01-10 DIAGNOSIS — E031 Congenital hypothyroidism without goiter: Secondary | ICD-10-CM

## 2019-02-10 ENCOUNTER — Other Ambulatory Visit: Payer: Self-pay | Admitting: Physician Assistant

## 2019-02-10 DIAGNOSIS — E031 Congenital hypothyroidism without goiter: Secondary | ICD-10-CM

## 2019-02-11 ENCOUNTER — Other Ambulatory Visit: Payer: Self-pay | Admitting: Physician Assistant

## 2019-02-11 DIAGNOSIS — E031 Congenital hypothyroidism without goiter: Secondary | ICD-10-CM

## 2019-02-11 MED ORDER — LEVOTHYROXINE SODIUM 100 MCG PO TABS: 100.0000 ug | ORAL_TABLET | Freq: Every day | ORAL | 0 refills | Status: DC

## 2019-02-21 ENCOUNTER — Other Ambulatory Visit: Payer: Self-pay | Admitting: Physician Assistant

## 2019-02-21 DIAGNOSIS — F331 Major depressive disorder, recurrent, moderate: Secondary | ICD-10-CM

## 2019-02-21 DIAGNOSIS — M25562 Pain in left knee: Secondary | ICD-10-CM

## 2019-02-21 DIAGNOSIS — R5382 Chronic fatigue, unspecified: Secondary | ICD-10-CM

## 2019-02-21 DIAGNOSIS — G894 Chronic pain syndrome: Secondary | ICD-10-CM

## 2019-02-21 DIAGNOSIS — M7061 Trochanteric bursitis, right hip: Secondary | ICD-10-CM

## 2019-02-21 MED ORDER — BUPROPION HCL 75 MG PO TABS: 75.0000 mg | ORAL_TABLET | ORAL | 0 refills | Status: DC

## 2019-02-25 ENCOUNTER — Other Ambulatory Visit: Payer: Self-pay | Admitting: Physician Assistant

## 2019-02-25 DIAGNOSIS — R5382 Chronic fatigue, unspecified: Secondary | ICD-10-CM

## 2019-02-25 DIAGNOSIS — F331 Major depressive disorder, recurrent, moderate: Secondary | ICD-10-CM

## 2019-03-01 ENCOUNTER — Other Ambulatory Visit: Payer: Self-pay | Admitting: Physician Assistant

## 2019-03-01 DIAGNOSIS — E031 Congenital hypothyroidism without goiter: Secondary | ICD-10-CM

## 2019-03-04 ENCOUNTER — Other Ambulatory Visit: Payer: Self-pay | Admitting: Physician Assistant

## 2019-03-04 DIAGNOSIS — E031 Congenital hypothyroidism without goiter: Secondary | ICD-10-CM

## 2019-03-04 MED ORDER — LEVOTHYROXINE SODIUM 100 MCG PO TABS: 100.0000 ug | ORAL_TABLET | Freq: Every day | ORAL | 0 refills | Status: DC

## 2019-03-21 ENCOUNTER — Encounter: Payer: Self-pay | Admitting: Osteopathic Medicine

## 2019-03-21 ENCOUNTER — Ambulatory Visit (INDEPENDENT_AMBULATORY_CARE_PROVIDER_SITE_OTHER): Payer: BLUE CROSS/BLUE SHIELD | Admitting: Osteopathic Medicine

## 2019-03-21 VITALS — Temp 97.5°F | Wt 190.0 lb

## 2019-03-21 DIAGNOSIS — R5383 Other fatigue: Secondary | ICD-10-CM

## 2019-03-21 DIAGNOSIS — R5381 Other malaise: Secondary | ICD-10-CM

## 2019-03-21 DIAGNOSIS — G894 Chronic pain syndrome: Secondary | ICD-10-CM

## 2019-03-21 DIAGNOSIS — R030 Elevated blood-pressure reading, without diagnosis of hypertension: Secondary | ICD-10-CM

## 2019-03-21 DIAGNOSIS — N951 Menopausal and female climacteric states: Secondary | ICD-10-CM

## 2019-03-21 DIAGNOSIS — E039 Hypothyroidism, unspecified: Secondary | ICD-10-CM

## 2019-03-21 DIAGNOSIS — Z748 Other problems related to care provider dependency: Secondary | ICD-10-CM

## 2019-03-21 DIAGNOSIS — E782 Mixed hyperlipidemia: Secondary | ICD-10-CM

## 2019-03-21 DIAGNOSIS — E66812 Obesity, class 2: Secondary | ICD-10-CM

## 2019-03-21 DIAGNOSIS — J302 Other seasonal allergic rhinitis: Secondary | ICD-10-CM

## 2019-03-21 DIAGNOSIS — L719 Rosacea, unspecified: Secondary | ICD-10-CM

## 2019-03-21 MED ORDER — LEVOTHYROXINE SODIUM 100 MCG PO TABS: 100.0000 ug | ORAL_TABLET | Freq: Every day | ORAL | 0 refills | Status: DC

## 2019-03-21 MED ORDER — BUPROPION HCL ER (XL) 150 MG PO TB24: 150.0000 mg | ORAL_TABLET | ORAL | 0 refills | Status: DC

## 2019-03-21 NOTE — Progress Notes (Signed)
Virtual Visit via Video (App used: [employer]ximity) Note  I connected with      Tiffany Best on 03/21/19 at 8:30 by a telemedicine application and verified that I am speaking with the correct person using two identifiers.  Patient is at home I am in office   I discussed the limitations of evaluation and management by telemedicine and the availability of in person appointments. The patient expressed understanding and agreed to proceed.  History of Present Illness: Tiffany Best is a 53 y.o. female who would like to discuss multiple issues - see headings below    Rosacea: Would like medication sent for rosacea outbreaks on face  Weight:gain over past year or so. Sedentary due to pain issues and fatigue.  Previous PCP had ordered labs but does not look like patient never got this done.  Patient is requesting a weight loss medication.  Wt Readings from Last 3 Encounters:  03/21/19 190 lb (86.2 kg)  10/09/18 185 lb (83.9 kg)  09/14/17 161 lb (73 kg)   Reports sinus issues ongoing for about a month or so.  Would like something sent to the pharmacy for this.  Refills: thyroid medication   Depression: Patient states that she is not consistently taking the bupropion.       Observations/Objective: Temp (!) 97.5 F (36.4 C) (Oral)   Wt 190 lb (86.2 kg)   BMI 37.11 kg/m  BP Readings from Last 3 Encounters:  09/14/17 (!) 140/99  09/14/17 (!) 140/99  08/02/17 (!) 152/101   Exam: Normal Speech.  nad  Lab and Radiology Results No results found for this or any previous visit (from the past 72 hour(s)). No results found.     Assessment and Plan: 53 y.o. female with The primary encounter diagnosis was Hypothyroidism, unspecified type. Diagnoses of Seasonal allergic rhinitis, unspecified trigger, Malaise and fatigue, Class 2 severe obesity due to excess calories with serious comorbidity in adult, unspecified BMI ([place]), Perimenopausal, Elevated blood pressure reading, Mixed  hyperlipidemia, Chronic pain syndrome, Dependent for transportation, and Rosacea were also pertinent to this visit.   PDMP not reviewed this encounter. Orders Placed This Encounter  Procedures  . CBC  . COMPLETE METABOLIC PANEL WITH GFR  . Lipid panel  . TSH   Meds ordered this encounter  Medications  . buPROPion (WELLBUTRIN XL) 150 MG 24 hr tablet    Sig: Take 1 tablet (150 mg total) by mouth every morning.    Dispense:  90 tablet    Refill:  0  . levothyroxine (SYNTHROID) 100 MCG tablet    Sig: Take 1 tablet (100 mcg total) by mouth daily before breakfast. ABSOLUTELY NO REFILLS UNTIL LABS ARE DONE -DR A    Dispense:  90 tablet    Refill:  0    DX Code Needed  .  . metroNIDAZOLE (METROGEL) 1 % gel    Sig: Apply topically daily. For rosacea    Dispense:  60 g    Refill:  1  . fluticasone (FLONASE) 50 MCG/ACT nasal spray    Sig: Place 1-2 sprays into both nostrils daily.    Dispense:  48 g    Refill:  3  . celecoxib (CELEBREX) 100 MG capsule    Sig: Take 1 capsule (100 mg total) by mouth 2 (two) times daily as needed for moderate pain.    Dispense:  60 capsule    Refill:  3      Follow Up Instructions: Return in about 4 weeks (around  04/18/2019) for RECHECK ON MEDICATION (BUPROPION) - VIRTUAL VISIT OK .    I discussed the assessment and treatment plan with the patient. The patient was provided an opportunity to ask questions and all were answered. The patient agreed with the plan and demonstrated an understanding of the instructions.   The patient was advised to call back or seek an in-person evaluation if any new concerns, if symptoms worsen or if the condition fails to improve as anticipated.  25 minutes of non-face-to-face time was provided during this encounter.                      Historical information moved to improve visibility of documentation.  Past Medical History:  Diagnosis Date  . Plantar fasciitis, left 08/02/2017  . Thyroid disease    . Tibialis posterior tendinitis, left 08/02/2017  . Trochanteric bursitis of both hips 08/02/2017   Past Surgical History:  Procedure Laterality Date  . APPENDECTOMY     Social History   Tobacco Use  . Smoking status: Current Every Day Smoker    Years: 15.00    Types: Cigarettes  . Smokeless tobacco: Never Used  . Tobacco comment: 6-8 cigs daily  Substance Use Topics  . Alcohol use: Yes    Alcohol/week: 2.0 - 3.0 standard drinks    Types: 2 - 3 Standard drinks or equivalent per week   Family history is unknown by patient.  Medications: Current Outpatient Medications  Medication Sig Dispense Refill  . celecoxib (CELEBREX) 100 MG capsule Take 1 capsule (100 mg total) by mouth 2 (two) times daily as needed for moderate pain. 60 capsule 3  . Cholecalciferol (VITAMIN D3) 250 MCG (10000 UT) capsule Take 10,000 Units by mouth daily.    Marland Kitchen. levocetirizine (XYZAL) 5 MG tablet Take 1 tablet (5 mg total) by mouth every evening. 90 tablet 3  . levothyroxine (SYNTHROID) 100 MCG tablet Take 1 tablet (100 mcg total) by mouth daily before breakfast. ABSOLUTELY NO REFILLS UNTIL LABS ARE DONE -DR A 90 tablet 0  . NONFORMULARY OR COMPOUNDED ITEM [id number] Bath/shower chair, with or without wheels, any size. 1 each 0  . NONFORMULARY OR COMPOUNDED ITEM E0241 Bath tub wall rail 2 each 0  . Plecanatide (TRULANCE) 3 MG TABS Take 3 mg by mouth daily. 90 tablet 0  . tetrahydrozoline-zinc (VISINE-AC) 0.05-0.25 % ophthalmic solution 2 drops 3 (three) times daily as needed.    . traMADol (ULTRAM) 50 MG tablet Take 1 tablet (50 mg total) by mouth every 6 (six) hours as needed for severe pain. Maximum 6 tabs/day 20 tablet 0  . vitamin B-12 (CYANOCOBALAMIN) 500 MCG tablet Take 500 mcg by mouth daily.    Marland Kitchen. buPROPion (WELLBUTRIN XL) 150 MG 24 hr tablet Take 1 tablet (150 mg total) by mouth every morning. 90 tablet 0  . fluticasone (FLONASE) 50 MCG/ACT nasal spray Place 1-2 sprays into both nostrils daily. 48 g 3  .  metroNIDAZOLE (METROGEL) 1 % gel Apply topically daily. For rosacea 60 g 1   No current facility-administered medications for this visit.    Allergies  Allergen Reactions  . Penicillins Hives and Rash    PDMP not reviewed this encounter. Orders Placed This Encounter  Procedures  . CBC  . COMPLETE METABOLIC PANEL WITH GFR  . Lipid panel  . TSH   Meds ordered this encounter  Medications  . buPROPion (WELLBUTRIN XL) 150 MG 24 hr tablet    Sig: Take 1 tablet (150 mg  total) by mouth every morning.    Dispense:  90 tablet    Refill:  0  . levothyroxine (SYNTHROID) 100 MCG tablet    Sig: Take 1 tablet (100 mcg total) by mouth daily before breakfast. ABSOLUTELY NO REFILLS UNTIL LABS ARE DONE -DR A    Dispense:  90 tablet    Refill:  0    DX Code Needed  .  . metroNIDAZOLE (METROGEL) 1 % gel    Sig: Apply topically daily. For rosacea    Dispense:  60 g    Refill:  1  . fluticasone (FLONASE) 50 MCG/ACT nasal spray    Sig: Place 1-2 sprays into both nostrils daily.    Dispense:  48 g    Refill:  3  . celecoxib (CELEBREX) 100 MG capsule    Sig: Take 1 capsule (100 mg total) by mouth 2 (two) times daily as needed for moderate pain.    Dispense:  60 capsule    Refill:  3

## 2019-03-21 NOTE — Progress Notes (Signed)
Attempted to contact pt at 810 am. No answer, left a vm msg.

## 2019-03-22 MED ORDER — FLUTICASONE PROPIONATE 50 MCG/ACT NA SUSP: 1.0000 | Freq: Every day | NASAL | 3 refills | Status: DC

## 2019-03-22 MED ORDER — CELECOXIB 100 MG PO CAPS: 100.0000 mg | ORAL_CAPSULE | Freq: Two times a day (BID) | ORAL | 3 refills | Status: DC | PRN

## 2019-03-22 MED ORDER — METRONIDAZOLE 1 % EX GEL: Freq: Every day | CUTANEOUS | 1 refills | Status: AC

## 2019-03-26 ENCOUNTER — Telehealth: Payer: Self-pay | Admitting: Osteopathic Medicine

## 2019-03-26 NOTE — Telephone Encounter (Signed)
I am not sure why the pharmacy would be taking so long, this is a pretty standard medication.  Can we call pharmacy to confirm this information?  We can send it to another pharmacy if desired

## 2019-03-26 NOTE — Telephone Encounter (Signed)
-----   Message from Emeterio Reeve, DO sent at 03/22/2019  2:58 PM EDT ----- Follow Up Instructions: Return in about 4 weeks (around 04/18/2019) for RECHECK ON MEDICATION (BUPROPION) - VIRTUAL VISIT OK .

## 2019-03-26 NOTE — Telephone Encounter (Signed)
Patient stating that she can not make the appointment for that date. States that the pharmacy will not have the medication for her with the new dosage until 10/23. She wanted to let her PCP know and she wasn't sure what to do. Please advise.

## 2019-03-26 NOTE — Telephone Encounter (Signed)
Called [facility] and spoke with Ulice Dash, the pharmacist. Per Ulice Dash, RX has been filled and ready to be picked up since 03/21/19.  Called and left pt msg advising of this.Tiffany Best asked she get RX and start new dose

## 2019-05-15 ENCOUNTER — Other Ambulatory Visit: Payer: Self-pay | Admitting: Osteopathic Medicine

## 2019-05-15 DIAGNOSIS — E039 Hypothyroidism, unspecified: Secondary | ICD-10-CM

## 2019-05-16 ENCOUNTER — Encounter: Payer: Self-pay | Admitting: Osteopathic Medicine

## 2019-05-16 NOTE — Telephone Encounter (Signed)
90-day supply was sent 03/21/2019, she should be good for another month.  She really needs to get labs done, the last thyroid I have on file for her was from April 2019, and this was abnormal.

## 2019-06-04 ENCOUNTER — Other Ambulatory Visit: Payer: Self-pay | Admitting: Osteopathic Medicine

## 2019-06-04 DIAGNOSIS — E039 Hypothyroidism, unspecified: Secondary | ICD-10-CM

## 2019-06-05 ENCOUNTER — Other Ambulatory Visit: Payer: Self-pay | Admitting: Osteopathic Medicine

## 2019-06-05 DIAGNOSIS — E039 Hypothyroidism, unspecified: Secondary | ICD-10-CM

## 2019-06-06 ENCOUNTER — Other Ambulatory Visit: Payer: Self-pay | Admitting: Physician Assistant

## 2019-06-06 DIAGNOSIS — G894 Chronic pain syndrome: Secondary | ICD-10-CM

## 2019-06-06 DIAGNOSIS — M25562 Pain in left knee: Secondary | ICD-10-CM

## 2019-06-06 DIAGNOSIS — M7061 Trochanteric bursitis, right hip: Secondary | ICD-10-CM

## 2019-06-10 MED ORDER — TRAMADOL HCL 50 MG PO TABS: 50.0000 mg | ORAL_TABLET | Freq: Four times a day (QID) | ORAL | 0 refills | Status: DC | PRN

## 2019-07-04 ENCOUNTER — Other Ambulatory Visit: Payer: Self-pay | Admitting: Osteopathic Medicine

## 2019-07-04 DIAGNOSIS — E039 Hypothyroidism, unspecified: Secondary | ICD-10-CM

## 2019-07-04 DIAGNOSIS — M25562 Pain in left knee: Secondary | ICD-10-CM

## 2019-07-04 DIAGNOSIS — M7061 Trochanteric bursitis, right hip: Secondary | ICD-10-CM

## 2019-07-04 DIAGNOSIS — M7062 Trochanteric bursitis, left hip: Secondary | ICD-10-CM

## 2019-07-04 DIAGNOSIS — G894 Chronic pain syndrome: Secondary | ICD-10-CM

## 2019-07-04 MED ORDER — TRAMADOL HCL 50 MG PO TABS: 50.0000 mg | ORAL_TABLET | Freq: Four times a day (QID) | ORAL | 0 refills | Status: DC | PRN

## 2019-07-04 NOTE — Telephone Encounter (Signed)
[facility] requesting med refill for tramadol.

## 2019-07-19 ENCOUNTER — Other Ambulatory Visit: Payer: Self-pay | Admitting: Physician Assistant

## 2019-07-19 DIAGNOSIS — K5904 Chronic idiopathic constipation: Secondary | ICD-10-CM

## 2019-08-06 ENCOUNTER — Other Ambulatory Visit: Payer: Self-pay | Admitting: Osteopathic Medicine

## 2019-08-06 ENCOUNTER — Telehealth: Payer: Self-pay

## 2019-08-06 DIAGNOSIS — E039 Hypothyroidism, unspecified: Secondary | ICD-10-CM

## 2019-08-06 NOTE — Telephone Encounter (Signed)
Audrea called and wanted to know if there is a service that can come to her house for a blood draw. She is scared to come out due to COVID-19. She states she really needs her thyroid medication.

## 2019-08-06 NOTE — Telephone Encounter (Signed)
No. Should be fine to come to lab with usual precautions

## 2019-08-07 NOTE — Telephone Encounter (Signed)
Attempted to contact pt regarding provider's note / lab order. Unable to leave a vm msg - machine cuts while leaving a msg.

## 2019-08-07 NOTE — Telephone Encounter (Signed)
Left a detailed vm msg for pt regarding provider's note. Aware no further refills will be given for thyroid rx without the completion of thyroid check. Direct contact info provided.

## 2019-08-27 ENCOUNTER — Other Ambulatory Visit: Payer: Self-pay

## 2019-08-27 ENCOUNTER — Other Ambulatory Visit: Payer: Self-pay | Admitting: Osteopathic Medicine

## 2019-08-27 DIAGNOSIS — E039 Hypothyroidism, unspecified: Secondary | ICD-10-CM

## 2019-08-27 LAB — TSH: TSH: 2 mIU/L

## 2019-08-27 MED ORDER — LEVOTHYROXINE SODIUM 100 MCG PO TABS: 100.0000 ug | ORAL_TABLET | Freq: Every day | ORAL | 0 refills | Status: DC

## 2019-08-30 ENCOUNTER — Other Ambulatory Visit: Payer: Self-pay | Admitting: Osteopathic Medicine

## 2019-08-30 DIAGNOSIS — E039 Hypothyroidism, unspecified: Secondary | ICD-10-CM

## 2019-08-30 MED ORDER — LEVOTHYROXINE SODIUM 100 MCG PO TABS: 100.0000 ug | ORAL_TABLET | Freq: Every day | ORAL | 3 refills | Status: DC

## 2019-10-01 ENCOUNTER — Other Ambulatory Visit: Payer: Self-pay | Admitting: Physician Assistant

## 2019-10-01 DIAGNOSIS — K5904 Chronic idiopathic constipation: Secondary | ICD-10-CM

## 2019-10-01 NOTE — Telephone Encounter (Signed)
Not familiar with this medication and needs as far as refills. Please advise. Patient's last appt 03/2019

## 2019-10-03 MED ORDER — TRULANCE 3 MG PO TABS: 1.0000 | ORAL_TABLET | Freq: Every day | ORAL | 1 refills | Status: DC

## 2019-10-08 ENCOUNTER — Telehealth: Payer: Self-pay | Admitting: Osteopathic Medicine

## 2019-10-08 NOTE — Telephone Encounter (Signed)
Received fax for PA on Trulance sent through cover my meds and received authorization valid from 10/08/2019 - 10/06/2020. - CF

## 2019-11-26 ENCOUNTER — Ambulatory Visit (INDEPENDENT_AMBULATORY_CARE_PROVIDER_SITE_OTHER): Payer: BLUE CROSS/BLUE SHIELD | Admitting: Osteopathic Medicine

## 2019-11-26 ENCOUNTER — Ambulatory Visit (INDEPENDENT_AMBULATORY_CARE_PROVIDER_SITE_OTHER): Payer: BLUE CROSS/BLUE SHIELD

## 2019-11-26 ENCOUNTER — Other Ambulatory Visit: Payer: Self-pay

## 2019-11-26 ENCOUNTER — Encounter: Payer: Self-pay | Admitting: Osteopathic Medicine

## 2019-11-26 VITALS — BP 124/81 | HR 83 | Wt 187.0 lb

## 2019-11-26 DIAGNOSIS — R1011 Right upper quadrant pain: Secondary | ICD-10-CM

## 2019-11-26 DIAGNOSIS — R238 Other skin changes: Secondary | ICD-10-CM

## 2019-11-26 DIAGNOSIS — J3089 Other allergic rhinitis: Secondary | ICD-10-CM | POA: Diagnosis not present

## 2019-11-26 LAB — COMPLETE METABOLIC PANEL WITH GFR
AG Ratio: 1.8 (calc) (ref 1.0–2.5)
ALT: 32 U/L — ABNORMAL HIGH (ref 6–29)
AST: 17 U/L (ref 10–35)
Albumin: 4.6 g/dL (ref 3.6–5.1)
Alkaline phosphatase (APISO): 74 U/L (ref 37–153)
BUN: 11 mg/dL (ref 7–25)
CO2: 27 mmol/L (ref 20–32)
Calcium: 10.2 mg/dL (ref 8.6–10.4)
Chloride: 107 mmol/L (ref 98–110)
Creat: 0.62 mg/dL (ref 0.50–1.05)
GFR, Est African American: 120 mL/min/{1.73_m2} (ref >=60)
GFR, Est Non African American: 104 mL/min/{1.73_m2} (ref >=60)
Globulin: 2.5 g/dL (calc) (ref 1.9–3.7)
Glucose, Bld: 99 mg/dL (ref 65–99)
Potassium: 4.7 mmol/L (ref 3.5–5.3)
Sodium: 142 mmol/L (ref 135–146)
Total Bilirubin: 0.4 mg/dL (ref 0.2–1.2)
Total Protein: 7.1 g/dL (ref 6.1–8.1)

## 2019-11-26 LAB — CBC
HCT: 40.9 % (ref 35.0–45.0)
Hemoglobin: 13.8 g/dL (ref 11.7–15.5)
MCH: 31.3 pg (ref 27.0–33.0)
MCHC: 33.7 g/dL (ref 32.0–36.0)
MCV: 92.7 fL (ref 80.0–100.0)
MPV: 10.5 fL (ref 7.5–12.5)
Platelets: 445 10*3/uL — ABNORMAL HIGH (ref 140–400)
RBC: 4.41 10*6/uL (ref 3.80–5.10)
RDW: 13.1 % (ref 11.0–15.0)
WBC: 10.3 10*3/uL (ref 3.8–10.8)

## 2019-11-26 MED ORDER — KETOCONAZOLE 2 % EX SHAM: 1.0000 "application " | MEDICATED_SHAMPOO | CUTANEOUS | 5 refills | Status: AC

## 2019-11-26 NOTE — Progress Notes (Signed)
Tiffany Best is a 54 y.o. female who presents to  [facility] at [facility]  today, 11/26/19, seeking care for the following: . Skin concern - scaling at base scalp ongoing about 5 months, itching,has tried treating w/ rubbing alcohol. Scaling, mild ulceration . Abdominal pain - RUQ occasional soreness, concerned given FH fatty liver, no nausea, no abnormal BM. No tenderness, BS WNL x4  Allergies - sinus congestion, L ear pressure. TM WNL, no lymphadenopathy    ASSESSMENT & PLAN with other pertinent history/findings:  The primary encounter diagnosis was Scalp irritation. Diagnoses of Right upper quadrant abdominal pain and Non-seasonal allergic rhinitis, unspecified trigger were also pertinent to this visit.  Patient Instructions  1. Scalp irritation Fungal vs inflammatory - let's try Ketoconazole antifungal shampoo for several weeks, if this is helpful can continue it once weekly. If not helpful, call me and I'll try sending steroid shampoo  2. Right upper quadrant abdominal pain Possible gallbladder / fatty liver problem. Labs and ultrasound to futher evaluate. Work on [employer] and increase exercise   3. Sinus congestion Continue Flonase nasal spray. Add Claritin/Allegra/Zyrtec antihistamine and take the nasal spray plus the antihistamine CONSISTENTLY. Can also add Sudafed as needed for severe symptoms. Saline rinses are helpful to clear out mucus but will NOT prevent mucus or congestion.       Orders Placed This Encounter  Procedures  . US ABDOMEN LIMITED RUQ  . CBC  . COMPLETE METABOLIC PANEL WITH GFR  . Lipid panel    Meds ordered this encounter  Medications  . ketoconazole (NIZORAL) 2 % shampoo    Sig: Apply 1 application topically 2 (two) times a week.    Dispense:  120 mL    Refill:  5       Follow-up instructions: Return for RECHECK PENDING RESULTS / IF WORSE OR  CHANGE.                                         BP 124/81 (BP Location: Left Arm, Patient Position: Sitting)   Pulse 83   Wt 187 lb (84.8 kg)   BMI 36.52 kg/m   Current Meds  Medication Sig  . celecoxib (CELEBREX) 100 MG capsule Take 1 capsule (100 mg total) by mouth 2 (two) times daily as needed for moderate pain.  . Cholecalciferol (VITAMIN D3) 250 MCG (10000 UT) capsule Take 10,000 Units by mouth daily.  . fluticasone (FLONASE) 50 MCG/ACT nasal spray Place 1-2 sprays into both nostrils daily.  Marland Kitchen levocetirizine (XYZAL) 5 MG tablet Take 1 tablet (5 mg total) by mouth every evening.  Marland Kitchen levothyroxine (SYNTHROID) 100 MCG tablet Take 1 tablet (100 mcg total) by mouth daily before breakfast.  . metroNIDAZOLE (METROGEL) 1 % gel Apply topically daily. For rosacea  . NONFORMULARY OR COMPOUNDED ITEM [id number] Bath/shower chair, with or without wheels, any size.  . NONFORMULARY OR COMPOUNDED ITEM E0241 Bath tub wall rail  . tetrahydrozoline-zinc (VISINE-AC) 0.05-0.25 % ophthalmic solution 2 drops 3 (three) times daily as needed.  . traMADol (ULTRAM) 50 MG tablet Take 1 tablet (50 mg total) by mouth every 6 (six) hours as needed for severe pain. Maximum 6 tabs/day  . vitamin B-12 (CYANOCOBALAMIN) 500 MCG tablet Take 500 mcg by mouth daily.    No results found for this or any previous visit (from the past 72 hour(s)).  No  results found.  Depression screen [facility] 2/9 03/21/2019 10/09/2018 09/14/2017  Decreased Interest 2 1 0  Down, Depressed, Hopeless 1 3 0  PHQ - 2 Score 3 4 0  Altered sleeping 2 2 -  Tired, decreased energy 3 3 -  Change in appetite 3 1 -  Feeling bad or failure about yourself  1 1 -  Trouble concentrating 2 0 -  Moving slowly or fidgety/restless 2 1 -  Suicidal thoughts 0 0 -  PHQ-9 Score 16 12 -  Difficult doing work/chores Very difficult - -    GAD 7 : Generalized Anxiety Score 03/21/2019 10/09/2018  Nervous, Anxious, on Edge 1 3   Control/stop worrying 0 2  Worry too much - different things 1 1  Trouble relaxing 3 1  Restless 0 1  Easily annoyed or irritable 1 1  Afraid - awful might happen 1 3  Total GAD 7 Score 7 12  Anxiety Difficulty Very difficult -      All questions at time of visit were answered - patient instructed to contact office with any additional concerns or updates.  ER/RTC precautions were reviewed with the patient.  Please note: voice recognition software was used to produce this document, and typos may escape review. Please contact Dr. Sheppard Coil for any needed clarifications.

## 2019-11-26 NOTE — Patient Instructions (Addendum)
1. Scalp irritation Fungal vs inflammatory - let's try Ketoconazole antifungal shampoo for several weeks, if this is helpful can continue it once weekly. If not helpful, call me and I'll try sending steroid shampoo  2. Right upper quadrant abdominal pain Possible gallbladder / fatty liver problem. Labs and ultrasound to futher evaluate. Work on [employer] and increase exercise   3. Sinus congestion Continue Flonase nasal spray. Add Claritin/Allegra/Zyrtec antihistamine and take the nasal spray plus the antihistamine CONSISTENTLY. Can also add Sudafed as needed for severe symptoms. Saline rinses are helpful to clear out mucus but will NOT prevent mucus or congestion.

## 2019-11-27 ENCOUNTER — Telehealth: Payer: Self-pay

## 2019-11-27 NOTE — Telephone Encounter (Signed)
Patient  Has called the office looking for lab  And ultra sound results, Please advise, Thanks

## 2019-12-03 ENCOUNTER — Telehealth: Payer: Self-pay

## 2019-12-03 NOTE — Telephone Encounter (Signed)
Pt called stating she has reviewed her most recent labs & [place] results. Pt would like to hold off on the Gastroenterologist referral. Requesting diet/rx recommendations from provider. Pt continues to have pain. Pls advise, thanks.

## 2019-12-03 NOTE — Telephone Encounter (Signed)
My advice is GI referral as stated Can try avoiding fatty foods

## 2019-12-04 NOTE — Telephone Encounter (Signed)
Unable to leave a message, voicemail is full.  

## 2019-12-04 NOTE — Telephone Encounter (Signed)
Pt has been updated of provider's recommendations. Per pt, she will think about the GI referral and will return a call back with her thoughts. No other inquiries during the call.

## 2019-12-09 ENCOUNTER — Telehealth: Payer: Self-pay

## 2019-12-09 NOTE — Telephone Encounter (Signed)
Patient left voicemail this am  wanting an Rx sent in to the pharmacy to help her stop smoking. Left message on patient voicemail to contact office to schedule appointment discuss with Dr. Lyn Hollingshead being this is a new issue. Patient was provided with number to contact office.

## 2020-01-14 ENCOUNTER — Encounter: Payer: Self-pay | Admitting: Osteopathic Medicine

## 2020-01-14 MED ORDER — FLUOCINOLONE ACETONIDE SCALP 0.01 % EX OIL: 1.0000 "application " | TOPICAL_OIL | CUTANEOUS | 5 refills | Status: AC

## 2020-01-25 ENCOUNTER — Other Ambulatory Visit: Payer: Self-pay | Admitting: Physician Assistant

## 2020-01-25 DIAGNOSIS — J302 Other seasonal allergic rhinitis: Secondary | ICD-10-CM

## 2020-06-02 ENCOUNTER — Telehealth (INDEPENDENT_AMBULATORY_CARE_PROVIDER_SITE_OTHER): Payer: BLUE CROSS/BLUE SHIELD | Admitting: Osteopathic Medicine

## 2020-06-02 DIAGNOSIS — Z5329 Procedure and treatment not carried out because of patient's decision for other reasons: Secondary | ICD-10-CM

## 2020-06-02 NOTE — Progress Notes (Signed)
Attempted to contact pt at 220 pm and 230 pm. No answer, left multiple detailed vm msgs.

## 2020-06-11 ENCOUNTER — Other Ambulatory Visit: Payer: Self-pay | Admitting: Osteopathic Medicine

## 2020-06-11 DIAGNOSIS — M25562 Pain in left knee: Secondary | ICD-10-CM

## 2020-06-11 DIAGNOSIS — M7061 Trochanteric bursitis, right hip: Secondary | ICD-10-CM

## 2020-06-11 DIAGNOSIS — G894 Chronic pain syndrome: Secondary | ICD-10-CM

## 2020-06-11 MED ORDER — TRAMADOL HCL 50 MG PO TABS
50.0000 mg | ORAL_TABLET | Freq: Four times a day (QID) | ORAL | 0 refills | Status: DC | PRN
Start: 2020-06-11 — End: 2022-10-23

## 2020-06-11 MED ORDER — BUPROPION HCL ER (XL) 150 MG PO TB24: 150.0000 mg | ORAL_TABLET | ORAL | 0 refills | Status: DC

## 2020-07-27 IMAGING — US US ABDOMEN LIMITED
1 series · 14 of 25 positions shown · non-contrast
Comparison: None.

CLINICAL DATA: Right upper quadrant abdominal pain. Intermittent
pain for 2 years.

EXAM:
ULTRASOUND ABDOMEN LIMITED RIGHT UPPER QUADRANT

[Series 1: us abdomen limited · 0.11mm/px · 14 of 71 slices shown]
[im 1/71]
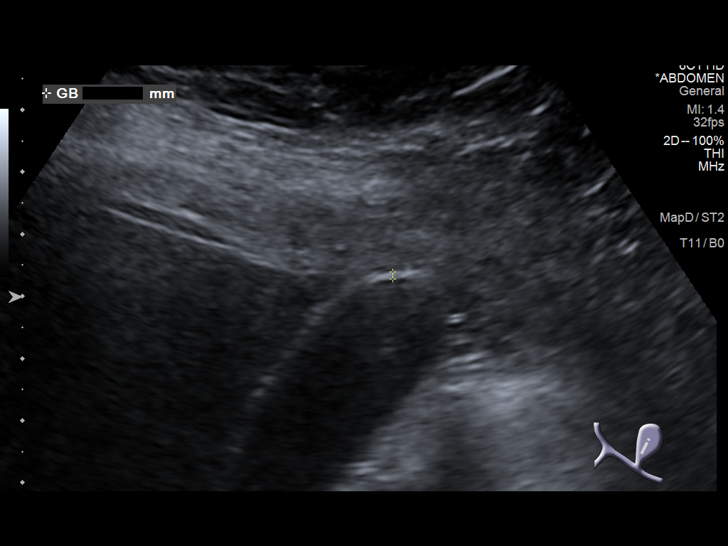
[im 6/71]
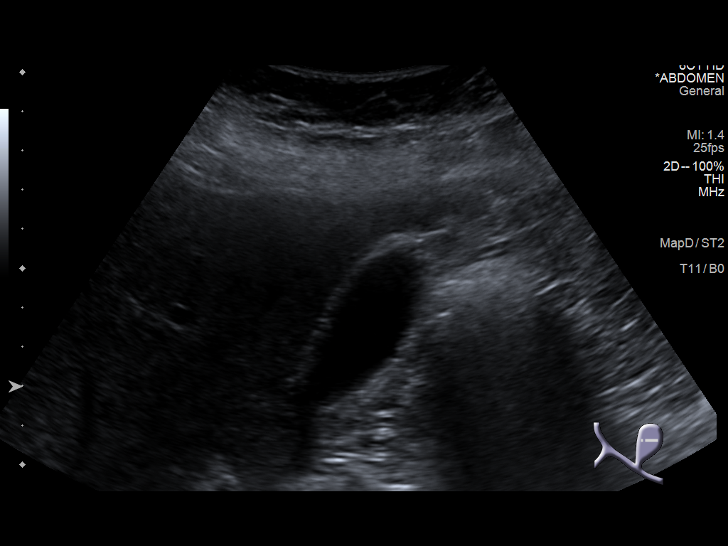
[im 12/71]
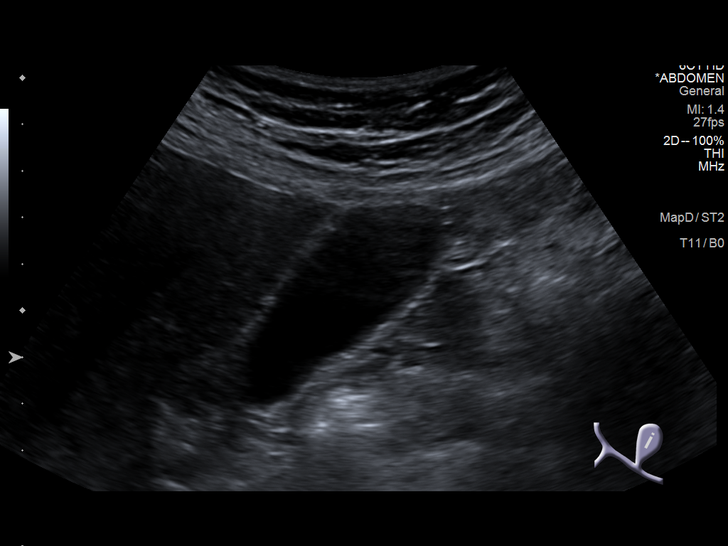
[im 18/71]
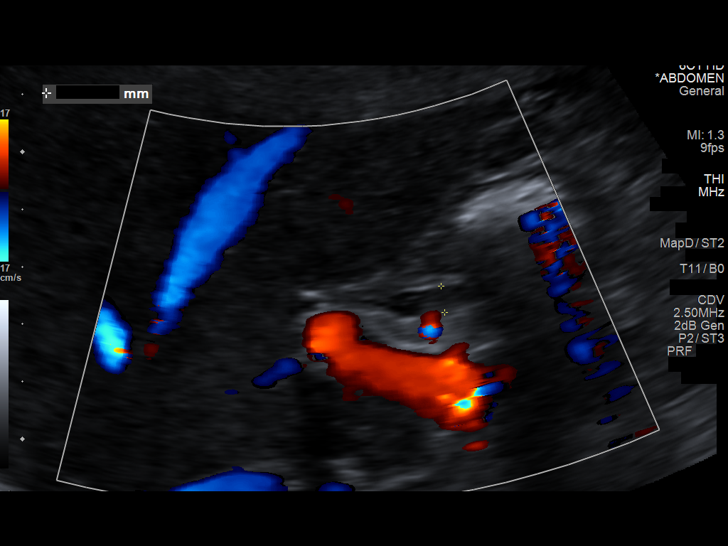
[im 24/71]
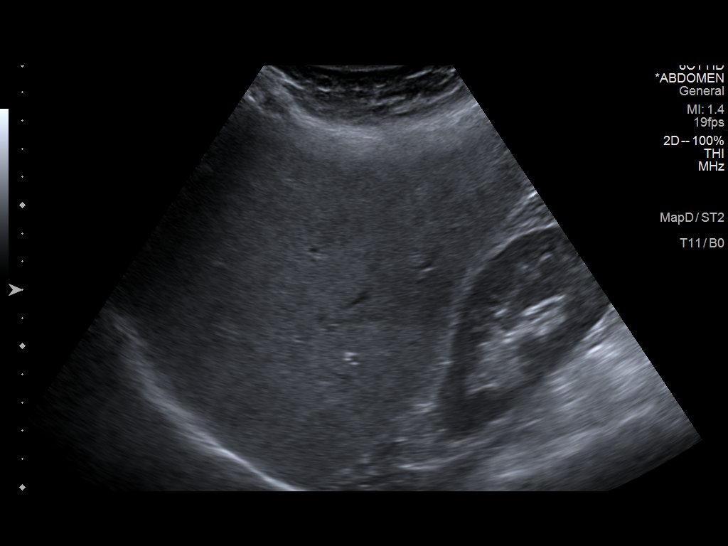
[im 27/71]
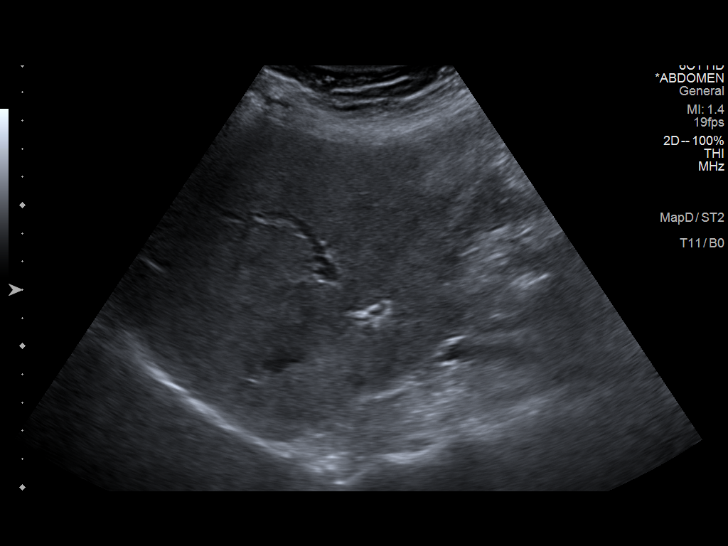
[im 33/71]
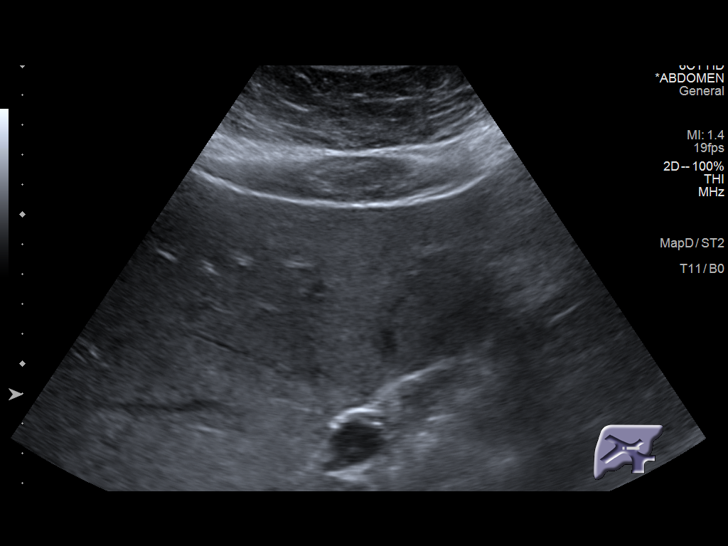
[im 38/71]
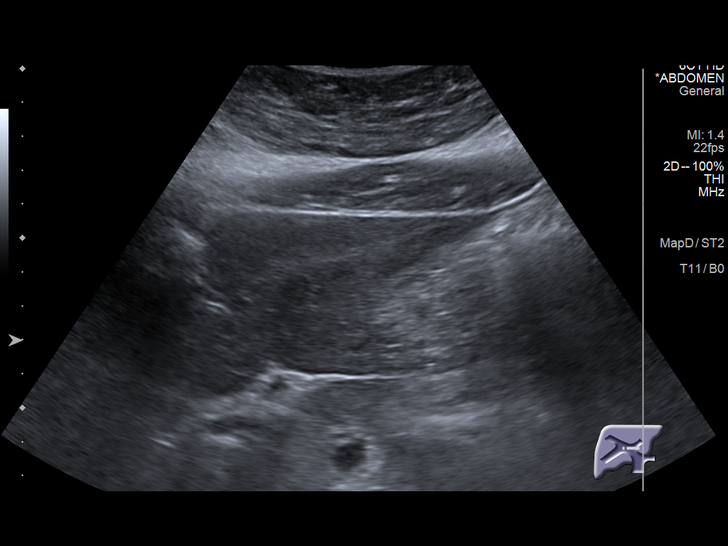
[im 44/71]
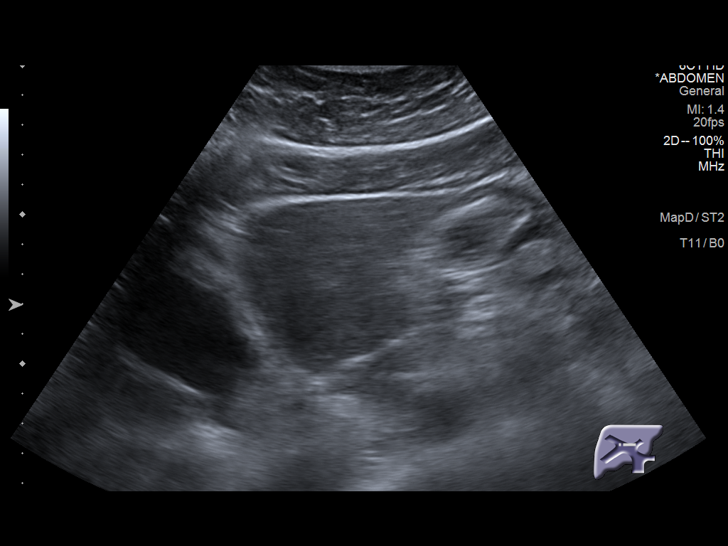
[im 47/71]
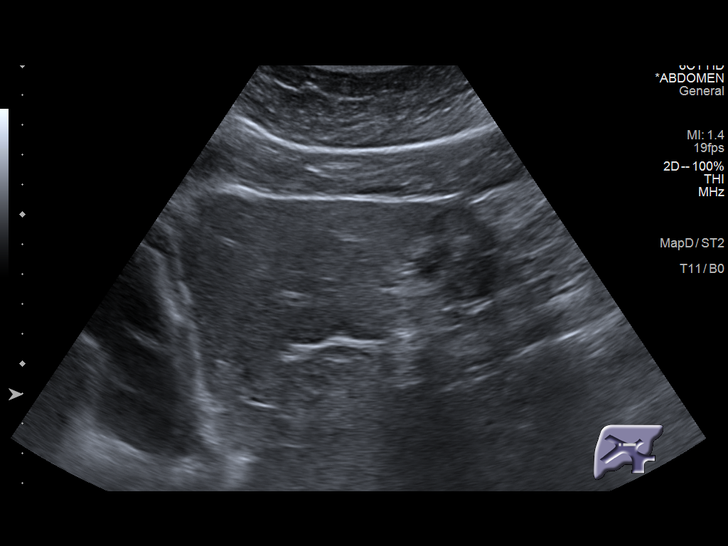
[im 53/71]
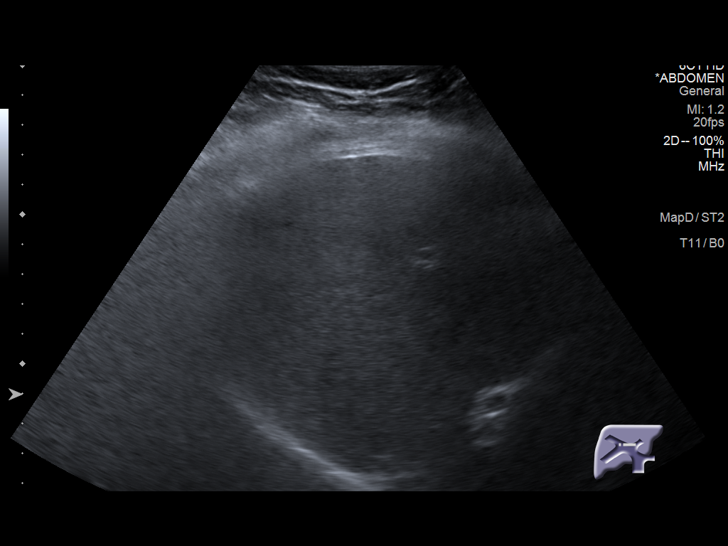
[im 59/71]
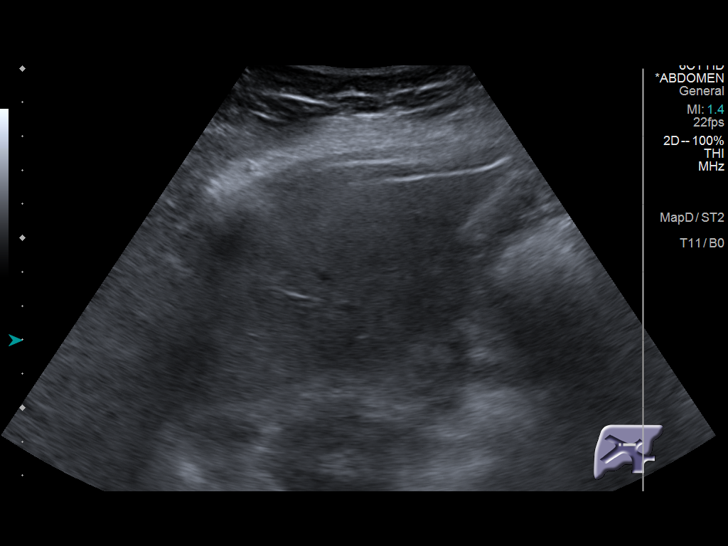
[im 65/71]
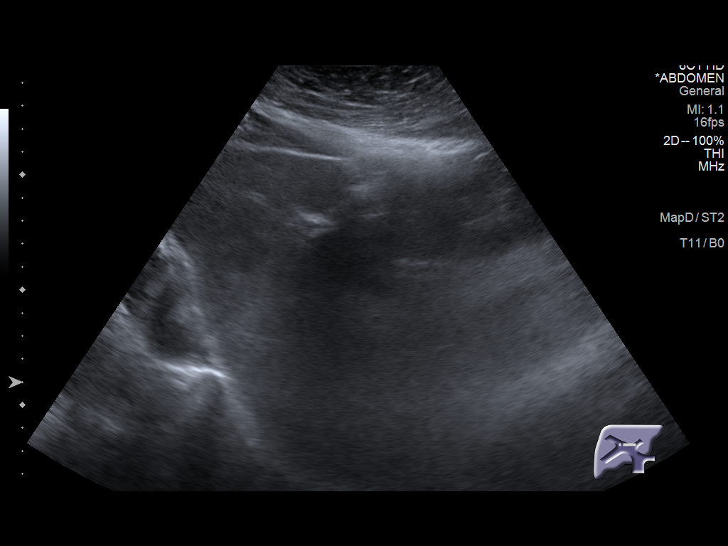
[im 71/71]
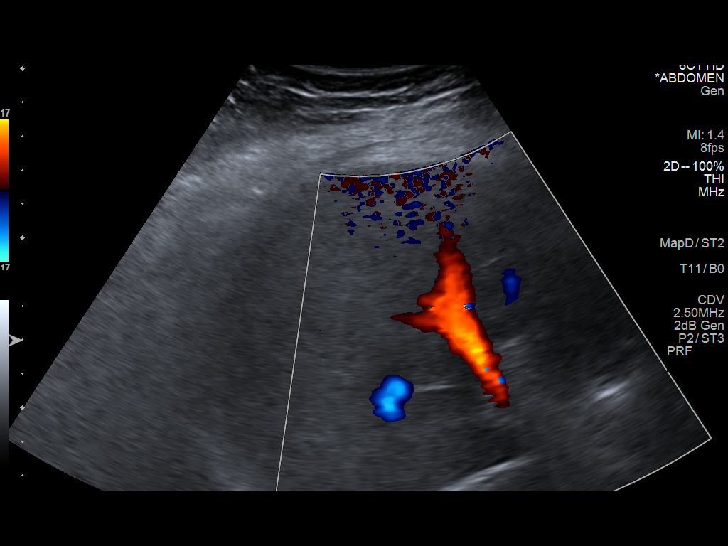

[14 of 25 positions shown; findings below may reference images not displayed]

FINDINGS: Gallbladder:

No gallstones or wall thickening visualized. No sonographic Murphy
sign noted by sonographer. Maximal wall thickness is within normal
limits at 1.1 mm.

Common bile duct:

Diameter: 4 6 mm within limits

Liver:

Liver is mildly hyperechoic with some heterogeneity. No discrete
lesions are present. No intrahepatic biliary dilation present.
Portal vein is patent on color Doppler imaging with normal direction
of blood flow towards the liver.

Other: None.
IMPRESSION: 1. Hyperechoic liver with some heterogeneity, likely reflecting
hepatic steatosis.
2. No focal hepatic lesion.
3. Normal sonographic appearance of the gallbladder and common bile
duct.

## 2020-08-11 ENCOUNTER — Other Ambulatory Visit: Payer: Self-pay

## 2020-08-11 ENCOUNTER — Encounter: Payer: Self-pay | Admitting: Osteopathic Medicine

## 2020-08-11 DIAGNOSIS — E039 Hypothyroidism, unspecified: Secondary | ICD-10-CM

## 2020-08-11 DIAGNOSIS — J302 Other seasonal allergic rhinitis: Secondary | ICD-10-CM

## 2020-08-11 MED ORDER — LEVOTHYROXINE SODIUM 100 MCG PO TABS: 100.0000 ug | ORAL_TABLET | Freq: Every day | ORAL | 0 refills | Status: DC

## 2020-08-11 MED ORDER — FLUTICASONE PROPIONATE 50 MCG/ACT NA SUSP: 1.0000 | Freq: Every day | NASAL | 3 refills | Status: DC

## 2020-08-20 ENCOUNTER — Encounter: Payer: Self-pay | Admitting: Osteopathic Medicine

## 2020-08-21 ENCOUNTER — Other Ambulatory Visit: Payer: Self-pay | Admitting: Osteopathic Medicine

## 2020-08-21 DIAGNOSIS — E039 Hypothyroidism, unspecified: Secondary | ICD-10-CM

## 2020-09-12 ENCOUNTER — Encounter: Payer: Self-pay | Admitting: Emergency Medicine

## 2020-09-12 ENCOUNTER — Other Ambulatory Visit: Payer: Self-pay

## 2020-09-12 ENCOUNTER — Emergency Department
Admission: EM | Admit: 2020-09-12 | Discharge: 2020-09-12 | Disposition: A | Discharge status: Home / Self Care | Payer: BLUE CROSS/BLUE SHIELD | Source: Home / Self Care

## 2020-09-12 DIAGNOSIS — R5383 Other fatigue: Secondary | ICD-10-CM

## 2020-09-12 DIAGNOSIS — T148XXA Other injury of unspecified body region, initial encounter: Secondary | ICD-10-CM | POA: Diagnosis not present

## 2020-09-12 DIAGNOSIS — M545 Low back pain, unspecified: Secondary | ICD-10-CM | POA: Diagnosis not present

## 2020-09-12 DIAGNOSIS — R300 Dysuria: Secondary | ICD-10-CM | POA: Diagnosis not present

## 2020-09-12 LAB — POCT URINALYSIS DIP (MANUAL ENTRY)
Bilirubin, UA: NEGATIVE
Blood, UA: NEGATIVE
Glucose, UA: NEGATIVE mg/dL
Ketones, POC UA: NEGATIVE mg/dL
Leukocytes, UA: NEGATIVE
Nitrite, UA: NEGATIVE
Protein Ur, POC: NEGATIVE mg/dL
Spec Grav, UA: 1.02 (ref 1.010–1.025)
Urobilinogen, UA: 0.2 E.U./dL
pH, UA: 5.5 (ref 5.0–8.0)

## 2020-09-12 MED ORDER — METHOCARBAMOL 500 MG PO TABS: 500.0000 mg | ORAL_TABLET | Freq: Two times a day (BID) | ORAL | 0 refills | Status: DC

## 2020-09-12 NOTE — ED Provider Notes (Signed)
[facility]   [id number] 09/12/20 Arrival Time: 1221  [id number] PAIN  SUBJECTIVE: History from: patient. Tiffany Best is a 55 y.o. female complains of right low back pain that began about a week ago. Reports urinary frequency as well. Denies a precipitating event or specific injury.  Describes the pain as constant and achy in character. Has tried OTC medications with some relief. Symptoms are made worse with activity. Denies similar symptoms in the past. Denies fever, chills, erythema, ecchymosis, effusion, weakness, numbness and tingling, saddle paresthesias, loss of bowel or bladder function.      ROS: As per HPI.  All other pertinent ROS negative.     Past Medical History:  Diagnosis Date  . Plantar fasciitis, left 08/02/2017  . Thyroid disease   . Tibialis posterior tendinitis, left 08/02/2017  . Trochanteric bursitis of both hips 08/02/2017   Past Surgical History:  Procedure Laterality Date  . APPENDECTOMY     Allergies  Allergen Reactions  . Penicillins Hives and Rash   No current facility-administered medications on file prior to encounter.   Current Outpatient Medications on File Prior to Encounter  Medication Sig Dispense Refill  . buPROPion (WELLBUTRIN XL) 150 MG 24 hr tablet Take 1 tablet (150 mg total) by mouth every morning. 90 tablet 0  . celecoxib (CELEBREX) 100 MG capsule Take 1 capsule (100 mg total) by mouth 2 (two) times daily as needed for moderate pain. 60 capsule 3  . Cholecalciferol (VITAMIN D3) 250 MCG (10000 UT) capsule Take 10,000 Units by mouth daily.    . Fluocinolone Acetonide Scalp 0.01 % OIL Apply 1 application topically 2 (two) times a week. 118.28 mL 5  . fluticasone (FLONASE) 50 MCG/ACT nasal spray Place 1-2 sprays into both nostrils daily. 48 g 3  . ketoconazole (NIZORAL) 2 % shampoo Apply 1 application topically 2 (two) times a week. 120 mL 5  . levocetirizine (XYZAL) 5 MG tablet TAKE 1 TABLET BY MOUTH EVERY DAY IN THE EVENING 90 tablet 3   . levothyroxine (SYNTHROID) 100 MCG tablet Take 1 tablet (100 mcg total) by mouth daily before breakfast. 90 tablet 0  . metroNIDAZOLE (METROGEL) 1 % gel Apply topically daily. For rosacea 60 g 1  . NONFORMULARY OR COMPOUNDED ITEM E0241 Bath tub wall rail 2 each 0  . tetrahydrozoline-zinc (VISINE-AC) 0.05-0.25 % ophthalmic solution 2 drops 3 (three) times daily as needed.    . traMADol (ULTRAM) 50 MG tablet Take 1 tablet (50 mg total) by mouth every 6 (six) hours as needed for severe pain. Maximum 6 tabs/day 20 tablet 0  . vitamin B-12 (CYANOCOBALAMIN) 500 MCG tablet Take 500 mcg by mouth daily.    . NONFORMULARY OR COMPOUNDED ITEM [id number] Bath/shower chair, with or without wheels, any size. 1 each 0  . Plecanatide (TRULANCE) 3 MG TABS Take 1 tablet by mouth daily. (Patient not taking: No sig reported) 90 tablet 1   Social History   Socioeconomic History  . Marital status: Single    Spouse name: Not on file  . Number of children: Not on file  . Years of education: Not on file  . Highest education level: Not on file  Occupational History  . Not on file  Tobacco Use  . Smoking status: Current Every Day Smoker    Years: 15.00    Types: Cigarettes  . Smokeless tobacco: Never Used  . Tobacco comment: 6-8 cigs daily  Substance and Sexual Activity  . Alcohol use: Yes    Alcohol/week:  2.0 - 3.0 standard drinks    Types: 2 - 3 Standard drinks or equivalent per week  . Drug use: Never  . Sexual activity: Yes    Birth control/protection: None  Other Topics Concern  . Not on file  Social History Narrative  . Not on file   Social Determinants of Health   Financial Resource Strain: Not on file  Food Insecurity: Not on file  Transportation Needs: Not on file  Physical Activity: Not on file  Stress: Not on file  Social Connections: Not on file  Intimate Partner Violence: Not on file   Family History  Family history unknown: Yes    OBJECTIVE:  Vitals:   09/12/20 1249  BP: 118/78   Pulse: 77  Temp: 97.9 F (36.6 C)  TempSrc: Oral  SpO2: 96%    General appearance: ALERT; in no acute distress.  Head: NCAT Lungs: Normal respiratory effort CV: pulses 2+ bilaterally. Cap refill < 2 seconds Musculoskeletal:  Inspection: Skin warm, dry, clear and intact No erythema, effusion noted Palpation: R low back mildly tender to palpation ROM: Limited ROM active and passive to back with bending, twisting, changing positions Skin: warm and dry Neurologic: Ambulates without difficulty; Sensation intact about the upper/ lower extremities Psychological: alert and cooperative; normal mood and affect  DIAGNOSTIC STUDIES:  No results found.   ASSESSMENT & PLAN:  1. Muscle strain   2. Dysuria   3. Other fatigue   4. Acute right-sided low back pain without sciatica     Meds ordered this encounter  Medications  . methocarbamol (ROBAXIN) 500 MG tablet    Sig: Take 1 tablet (500 mg total) by mouth 2 (two) times daily.    Dispense:  20 tablet    Refill:  0    Order Specific Question:   Supervising Provider    Answer:   Merrilee Jansky [[id number]]    UA unremarkable today More likely musculoskeletal in origin Prescribed robaxin BID prn muscle spasms Continue conservative management of rest, ice, and gentle stretches Take ibuprofen as needed for pain relief (may cause abdominal discomfort, ulcers, and GI bleeds avoid taking with other NSAIDs) Take robaxin at nighttime for symptomatic relief. Avoid driving or operating heavy machinery while using medication. Follow up with PCP if symptoms persist Return or go to the ER if you have any new or worsening symptoms (fever, chills, chest pain, abdominal pain, changes in bowel or bladder habits, pain radiating into lower legs)   Reviewed expectations re: course of current medical issues. Questions answered. Outlined signs and symptoms indicating need for more acute intervention. Patient verbalized understanding. After Visit  Summary given.       Moshe Cipro, NP 09/12/20 1423

## 2020-09-12 NOTE — Discharge Instructions (Addendum)
I have sent in robaxin for you to take twice a day as needed for muscle spasms. This medication may make you sleepy. Do not drive or operate heavy machinery while taking this medication.  Follow up with this office or with primary care if symptoms are persisting.  Follow up in the ER for high fever, trouble swallowing, trouble breathing, other concerning symptoms.

## 2020-09-12 NOTE — ED Triage Notes (Signed)
Patient c/o fatigue, dull low back pain, urinary frequency x 6 days.  Patient has been taking Ibuprofen for discomfort.  Patient is not vaccinated.

## 2020-09-14 ENCOUNTER — Encounter: Payer: Self-pay | Admitting: Osteopathic Medicine

## 2020-11-02 ENCOUNTER — Other Ambulatory Visit: Payer: Self-pay | Admitting: Osteopathic Medicine

## 2020-11-02 DIAGNOSIS — E039 Hypothyroidism, unspecified: Secondary | ICD-10-CM

## 2020-11-03 MED ORDER — LEVOTHYROXINE SODIUM 100 MCG PO TABS: 100.0000 ug | ORAL_TABLET | Freq: Every day | ORAL | 0 refills | Status: DC

## 2020-12-07 ENCOUNTER — Other Ambulatory Visit: Payer: Self-pay | Admitting: Osteopathic Medicine

## 2020-12-07 DIAGNOSIS — E039 Hypothyroidism, unspecified: Secondary | ICD-10-CM

## 2020-12-08 ENCOUNTER — Other Ambulatory Visit: Payer: Self-pay | Admitting: Osteopathic Medicine

## 2020-12-08 ENCOUNTER — Encounter: Payer: Self-pay | Admitting: Osteopathic Medicine

## 2020-12-08 DIAGNOSIS — E782 Mixed hyperlipidemia: Secondary | ICD-10-CM

## 2020-12-08 DIAGNOSIS — E039 Hypothyroidism, unspecified: Secondary | ICD-10-CM

## 2020-12-08 DIAGNOSIS — Z0000 Encounter for general adult medical examination without abnormal findings: Secondary | ICD-10-CM

## 2020-12-09 NOTE — Telephone Encounter (Signed)
Annual labs ordered. Does provider need to add other testing?

## 2020-12-19 LAB — COMPLETE METABOLIC PANEL WITH GFR
AG Ratio: 1.7 (calc) (ref 1.0–2.5)
ALT: 31 U/L — ABNORMAL HIGH (ref 6–29)
AST: 21 U/L (ref 10–35)
Albumin: 4.5 g/dL (ref 3.6–5.1)
Alkaline phosphatase (APISO): 71 U/L (ref 37–153)
BUN: 18 mg/dL (ref 7–25)
CO2: 28 mmol/L (ref 20–32)
Calcium: 9.8 mg/dL (ref 8.6–10.4)
Chloride: 105 mmol/L (ref 98–110)
Creat: 0.6 mg/dL (ref 0.50–1.05)
GFR, Est African American: 121 mL/min/{1.73_m2} (ref >=60)
GFR, Est Non African American: 104 mL/min/{1.73_m2} (ref >=60)
Globulin: 2.6 g/dL (calc) (ref 1.9–3.7)
Glucose, Bld: 85 mg/dL (ref 65–99)
Potassium: 4.6 mmol/L (ref 3.5–5.3)
Sodium: 142 mmol/L (ref 135–146)
Total Bilirubin: 0.4 mg/dL (ref 0.2–1.2)
Total Protein: 7.1 g/dL (ref 6.1–8.1)

## 2020-12-19 LAB — CBC WITH DIFFERENTIAL/PLATELET
Absolute Monocytes: 678 cells/uL (ref 200–950)
Basophils Absolute: 70 cells/uL (ref 0–200)
Basophils Relative: 1.1 %
Eosinophils Absolute: 198 cells/uL (ref 15–500)
Eosinophils Relative: 3.1 %
HCT: 42.3 % (ref 35.0–45.0)
Hemoglobin: 13.7 g/dL (ref 11.7–15.5)
Lymphs Abs: 1811 cells/uL (ref 850–3900)
MCH: 30 pg (ref 27.0–33.0)
MCHC: 32.4 g/dL (ref 32.0–36.0)
MCV: 92.6 fL (ref 80.0–100.0)
MPV: 10.8 fL (ref 7.5–12.5)
Monocytes Relative: 10.6 %
Neutro Abs: 3642 cells/uL (ref 1500–7800)
Neutrophils Relative %: 56.9 %
Platelets: 399 10*3/uL (ref 140–400)
RBC: 4.57 10*6/uL (ref 3.80–5.10)
RDW: 12.7 % (ref 11.0–15.0)
Total Lymphocyte: 28.3 %
WBC: 6.4 10*3/uL (ref 3.8–10.8)

## 2020-12-19 LAB — LIPID PANEL
Cholesterol: 248 mg/dL — ABNORMAL HIGH (ref <200)
HDL: 65 mg/dL (ref >=50)
LDL Cholesterol (Calc): 162 mg/dL (calc) — ABNORMAL HIGH
Non-HDL Cholesterol (Calc): 183 mg/dL (calc) — ABNORMAL HIGH (ref <130)
Total CHOL/HDL Ratio: 3.8 (calc) (ref <5.0)
Triglycerides: 97 mg/dL (ref <150)

## 2020-12-19 LAB — TSH: TSH: 0.14 mIU/L — ABNORMAL LOW

## 2020-12-22 ENCOUNTER — Other Ambulatory Visit: Payer: Self-pay | Admitting: Osteopathic Medicine

## 2020-12-22 DIAGNOSIS — E039 Hypothyroidism, unspecified: Secondary | ICD-10-CM

## 2020-12-24 MED ORDER — LEVOTHYROXINE SODIUM 88 MCG PO TABS: 88.0000 ug | ORAL_TABLET | Freq: Every day | ORAL | 0 refills | Status: DC

## 2021-01-24 ENCOUNTER — Other Ambulatory Visit: Payer: Self-pay | Admitting: Osteopathic Medicine

## 2021-01-24 DIAGNOSIS — E039 Hypothyroidism, unspecified: Secondary | ICD-10-CM

## 2021-03-17 ENCOUNTER — Ambulatory Visit (INDEPENDENT_AMBULATORY_CARE_PROVIDER_SITE_OTHER): Payer: BLUE CROSS/BLUE SHIELD | Admitting: Medical-Surgical

## 2021-03-17 ENCOUNTER — Other Ambulatory Visit: Payer: Self-pay

## 2021-03-17 ENCOUNTER — Encounter: Payer: Self-pay | Admitting: Medical-Surgical

## 2021-03-17 ENCOUNTER — Ambulatory Visit (INDEPENDENT_AMBULATORY_CARE_PROVIDER_SITE_OTHER): Payer: BLUE CROSS/BLUE SHIELD

## 2021-03-17 VITALS — BP 126/85 | HR 90 | Resp 20 | Ht 60.0 in | Wt 187.6 lb

## 2021-03-17 DIAGNOSIS — K5904 Chronic idiopathic constipation: Secondary | ICD-10-CM

## 2021-03-17 DIAGNOSIS — G894 Chronic pain syndrome: Secondary | ICD-10-CM

## 2021-03-17 DIAGNOSIS — Z2821 Immunization not carried out because of patient refusal: Secondary | ICD-10-CM

## 2021-03-17 DIAGNOSIS — Z7689 Persons encountering health services in other specified circumstances: Secondary | ICD-10-CM

## 2021-03-17 DIAGNOSIS — R030 Elevated blood-pressure reading, without diagnosis of hypertension: Secondary | ICD-10-CM

## 2021-03-17 DIAGNOSIS — L409 Psoriasis, unspecified: Secondary | ICD-10-CM

## 2021-03-17 DIAGNOSIS — T148XXA Other injury of unspecified body region, initial encounter: Secondary | ICD-10-CM

## 2021-03-17 DIAGNOSIS — R1011 Right upper quadrant pain: Secondary | ICD-10-CM

## 2021-03-17 DIAGNOSIS — R109 Unspecified abdominal pain: Secondary | ICD-10-CM | POA: Diagnosis not present

## 2021-03-17 DIAGNOSIS — J302 Other seasonal allergic rhinitis: Secondary | ICD-10-CM

## 2021-03-17 DIAGNOSIS — R10A1 Flank pain, right side: Secondary | ICD-10-CM

## 2021-03-17 DIAGNOSIS — E039 Hypothyroidism, unspecified: Secondary | ICD-10-CM

## 2021-03-17 DIAGNOSIS — R7989 Other specified abnormal findings of blood chemistry: Secondary | ICD-10-CM | POA: Diagnosis not present

## 2021-03-17 DIAGNOSIS — F331 Major depressive disorder, recurrent, moderate: Secondary | ICD-10-CM

## 2021-03-17 LAB — POCT URINALYSIS DIP (CLINITEK)
Bilirubin, UA: NEGATIVE
Blood, UA: NEGATIVE
Glucose, UA: NEGATIVE mg/dL
Ketones, POC UA: NEGATIVE mg/dL
Leukocytes, UA: NEGATIVE
Nitrite, UA: NEGATIVE
POC PROTEIN,UA: NEGATIVE
Spec Grav, UA: 1.015 (ref 1.010–1.025)
Urobilinogen, UA: 0.2 E.U./dL
pH, UA: 6 (ref 5.0–8.0)

## 2021-03-17 MED ORDER — FLUTICASONE PROPIONATE 50 MCG/ACT NA SUSP: 1.0000 | Freq: Every day | NASAL | 3 refills | Status: DC

## 2021-03-17 MED ORDER — TRIAMCINOLONE ACETONIDE 0.1 % EX OINT: 1.0000 "application " | TOPICAL_OINTMENT | Freq: Two times a day (BID) | CUTANEOUS | 6 refills | Status: DC

## 2021-03-17 MED ORDER — TRULANCE 3 MG PO TABS: 1.0000 | ORAL_TABLET | Freq: Every day | ORAL | 1 refills | Status: DC

## 2021-03-17 MED ORDER — CELECOXIB 100 MG PO CAPS: 100.0000 mg | ORAL_CAPSULE | Freq: Two times a day (BID) | ORAL | 3 refills | Status: DC | PRN

## 2021-03-17 MED ORDER — IPRATROPIUM BROMIDE 0.03 % NA SOLN: 2.0000 | Freq: Two times a day (BID) | NASAL | 5 refills | Status: DC

## 2021-03-17 MED ORDER — METHOCARBAMOL 500 MG PO TABS: 500.0000 mg | ORAL_TABLET | Freq: Two times a day (BID) | ORAL | 0 refills | Status: DC

## 2021-03-17 MED ORDER — MONTELUKAST SODIUM 10 MG PO TABS: 10.0000 mg | ORAL_TABLET | Freq: Every day | ORAL | 3 refills | Status: DC

## 2021-03-17 MED ORDER — BUPROPION HCL ER (XL) 150 MG PO TB24: 150.0000 mg | ORAL_TABLET | ORAL | 0 refills | Status: DC

## 2021-03-17 MED ORDER — LEVOCETIRIZINE DIHYDROCHLORIDE 5 MG PO TABS: ORAL_TABLET | ORAL | 3 refills | Status: DC

## 2021-03-17 NOTE — Progress Notes (Signed)
HPI with pertinent ROS:   CC: Transfer of care, right upper quadrant/flank pain  HPI: Tiffany Best 55 year old female presenting today for an acute visit to discuss right upper quadrant/flank pain that started about 1 week ago.  She has intermittent discomfort which can be intense at times.  Has bloating with pressure in the right upper quadrant abdominal area.  Last night, she had significant discomfort in the right upper quadrant that prompted her to be seen today.  This comes and goes and does not seem to be associated with any particular activity.  Dietary intake is pretty regular and she tries to avoid breads due to constipation.  She has daily bowel movements but sometimes she has to strain and has difficulty passing her stool.  She has fiber to her coffee every day.  Has not had any nausea, vomiting, hematochezia, melena, or hematemesis.  Having difficulty with her allergies.  She quit taking Allegra approximately 2 months ago and is no longer taking a daily antihistamine.  She is using a decongestant medication that she got over-the-counter.  She does have Flonase at home but is not using this regularly as she ran out of refills.  Has frequent issues with postnasal drip, ear congestion, sore throat, and eustachian tube dysfunction.  Has a spot on the back of her neck that is itchy and irritated.  This has been there off and on for weeks.  She was previously given ketoconazole shampoo but this did not make any difference in her symptoms.  I reviewed the past medical history, family history, social history, surgical history, and allergies today and no changes were needed.  Please see the problem list section below in epic for further details.   Physical exam:   General: Well Developed, well nourished, and in no acute distress.  Neuro: Alert and oriented x3.  HEENT: Normocephalic, atraumatic.  Skin: Warm and dry.  Ovoid rash to the posterior neck extending up into the scalp line consistent with  psoriasis. Cardiac: Regular rate and rhythm, no murmurs rubs or gallops, no lower extremity edema.  Respiratory: Clear to auscultation bilaterally. Not using accessory muscles, speaking in full sentences. Abdomen: Soft, mildly tender to the right upper quadrant with deep palpation, mild pain with Murphy sign, nondistended. Bowel sounds + x 4 quadrants. No HSM appreciated.  Impression and Recommendations:    1. Encounter to establish care Reviewed available information and discussed care concerns with patient.   2. Influenza vaccine refused Patient aware of risks versus benefits.  Continues to decline.  3. Refuses tetanus, diphtheria, and acellular pertussis (Tdap) vaccination Patient aware of risk versus benefits.  Continues to decline.  4. Right flank pain POCT urinalysis negative. - POCT URINALYSIS DIP (CLINITEK)  5. RUQ pain Checking labs as below.  We will go ahead and get an ultrasound of her right upper quadrant as she does have some tenderness to deep palpation and mildly positive Murphy's sign - CBC with Differential/Platelet - COMPLETE METABOLIC PANEL WITH GFR - Amylase - Lipase - US ABDOMEN LIMITED RUQ (LIVER/GB); Future  6. Elevated blood pressure reading Blood pressure elevated on initial check but recheck normal.  Advised low-sodium diet, exercise, and weight loss.  7. Hypothyroidism, unspecified type Checking TSH. - TSH  8. Chronic pain syndrome Restart Celebrex 100 mg twice daily as needed - celecoxib (CELEBREX) 100 MG capsule; Take 1 capsule (100 mg total) by mouth 2 (two) times daily as needed for moderate pain.  Dispense: 60 capsule; Refill: 3  9. Seasonal allergic  rhinitis, unspecified trigger Starting levocetirizine 5 mg nightly and Flonase nasal spray.  Sending in refills of both medications.  Start Singulair 10 mg nightly.  Adding Atrovent nasal spray. - fluticasone (FLONASE) 50 MCG/ACT nasal spray; Place 1-2 sprays into both nostrils daily.  Dispense:  48 g; Refill: 3 - levocetirizine (XYZAL) 5 MG tablet; TAKE 1 TABLET BY MOUTH EVERY DAY IN THE EVENING  Dispense: 90 tablet; Refill: 3  10. Muscle strain Sending in methocarbamol for muscle strain. - methocarbamol (ROBAXIN) 500 MG tablet; Take 1 tablet (500 mg total) by mouth 2 (two) times daily.  Dispense: 20 tablet; Refill: 0  11. Chronic idiopathic constipation Restart Trulance. - Plecanatide (TRULANCE) 3 MG TABS; Take 1 tablet by mouth daily.  Dispense: 90 tablet; Refill: 1  12.  Psoriasis of scalp Triamcinolone ointment twice daily for up to 14 days.  13.  Depression Continue Wellbutrin 150 mg daily.  Return if symptoms worsen or fail to improve.  Further follow-up pending imaging and lab results. ___________________________________________ Thayer Ohm, DNP, APRN, FNP-BC Primary Care and Sports Medicine [facility]

## 2021-03-18 LAB — CBC WITH DIFFERENTIAL/PLATELET
Absolute Monocytes: 653 cells/uL (ref 200–950)
Basophils Absolute: 102 cells/uL (ref 0–200)
Basophils Relative: 1.5 %
Eosinophils Absolute: 190 cells/uL (ref 15–500)
Eosinophils Relative: 2.8 %
HCT: 41.3 % (ref 35.0–45.0)
Hemoglobin: 13.7 g/dL (ref 11.7–15.5)
Lymphs Abs: 1727 cells/uL (ref 850–3900)
MCH: 30.9 pg (ref 27.0–33.0)
MCHC: 33.2 g/dL (ref 32.0–36.0)
MCV: 93 fL (ref 80.0–100.0)
MPV: 10.9 fL (ref 7.5–12.5)
Monocytes Relative: 9.6 %
Neutro Abs: 4128 cells/uL (ref 1500–7800)
Neutrophils Relative %: 60.7 %
Platelets: 394 10*3/uL (ref 140–400)
RBC: 4.44 10*6/uL (ref 3.80–5.10)
RDW: 13 % (ref 11.0–15.0)
Total Lymphocyte: 25.4 %
WBC: 6.8 10*3/uL (ref 3.8–10.8)

## 2021-03-18 LAB — COMPLETE METABOLIC PANEL WITH GFR
AG Ratio: 1.6 (calc) (ref 1.0–2.5)
ALT: 43 U/L — ABNORMAL HIGH (ref 6–29)
AST: 27 U/L (ref 10–35)
Albumin: 4.6 g/dL (ref 3.6–5.1)
Alkaline phosphatase (APISO): 77 U/L (ref 37–153)
BUN: 14 mg/dL (ref 7–25)
CO2: 28 mmol/L (ref 20–32)
Calcium: 9.7 mg/dL (ref 8.6–10.4)
Chloride: 105 mmol/L (ref 98–110)
Creat: 0.57 mg/dL (ref 0.50–1.03)
Globulin: 2.9 g/dL (calc) (ref 1.9–3.7)
Glucose, Bld: 106 mg/dL — ABNORMAL HIGH (ref 65–99)
Potassium: 4.2 mmol/L (ref 3.5–5.3)
Sodium: 141 mmol/L (ref 135–146)
Total Bilirubin: 0.4 mg/dL (ref 0.2–1.2)
Total Protein: 7.5 g/dL (ref 6.1–8.1)
eGFR: 109 mL/min/{1.73_m2} (ref >=60)

## 2021-03-18 LAB — AMYLASE: Amylase: 31 U/L (ref 21–101)

## 2021-03-18 LAB — TSH: TSH: 0.35 mIU/L — ABNORMAL LOW

## 2021-03-18 LAB — LIPASE: Lipase: 16 U/L (ref 7–60)

## 2021-03-18 MED ORDER — LEVOTHYROXINE SODIUM 75 MCG PO TABS: 75.0000 ug | ORAL_TABLET | Freq: Every day | ORAL | 0 refills | Status: DC

## 2021-03-18 NOTE — Addendum Note (Signed)
Addended byChristen Butter on: 03/18/2021 07:14 AM   Modules accepted: Orders

## 2021-03-19 ENCOUNTER — Encounter: Payer: Self-pay | Admitting: Medical-Surgical

## 2021-03-19 DIAGNOSIS — R1011 Right upper quadrant pain: Secondary | ICD-10-CM

## 2021-03-22 ENCOUNTER — Other Ambulatory Visit: Payer: Self-pay

## 2021-03-22 ENCOUNTER — Ambulatory Visit (INDEPENDENT_AMBULATORY_CARE_PROVIDER_SITE_OTHER): Payer: BLUE CROSS/BLUE SHIELD

## 2021-03-22 DIAGNOSIS — R1011 Right upper quadrant pain: Secondary | ICD-10-CM

## 2021-03-23 ENCOUNTER — Other Ambulatory Visit: Payer: Self-pay | Admitting: Osteopathic Medicine

## 2021-03-23 ENCOUNTER — Encounter: Payer: Self-pay | Admitting: Medical-Surgical

## 2021-03-23 DIAGNOSIS — G894 Chronic pain syndrome: Secondary | ICD-10-CM

## 2021-03-23 DIAGNOSIS — M7061 Trochanteric bursitis, right hip: Secondary | ICD-10-CM

## 2021-03-23 DIAGNOSIS — M25562 Pain in left knee: Secondary | ICD-10-CM

## 2021-03-24 ENCOUNTER — Encounter: Payer: Self-pay | Admitting: Medical-Surgical

## 2021-03-24 DIAGNOSIS — M5440 Lumbago with sciatica, unspecified side: Secondary | ICD-10-CM

## 2021-03-24 DIAGNOSIS — G8929 Other chronic pain: Secondary | ICD-10-CM

## 2021-03-25 ENCOUNTER — Encounter: Payer: Self-pay | Admitting: Medical-Surgical

## 2021-03-25 ENCOUNTER — Other Ambulatory Visit: Payer: Self-pay | Admitting: Osteopathic Medicine

## 2021-03-25 DIAGNOSIS — E039 Hypothyroidism, unspecified: Secondary | ICD-10-CM

## 2021-03-25 NOTE — Telephone Encounter (Signed)
Unable to reach patient.

## 2021-03-30 ENCOUNTER — Telehealth: Payer: Self-pay

## 2021-03-30 NOTE — Telephone Encounter (Signed)
Medication: Plecanatide (TRULANCE) 3 MG TABS Prior authorization submitted via CoverMyMeds on 03/30/2021 [place] submission pending

## 2021-03-31 ENCOUNTER — Telehealth: Payer: Self-pay

## 2021-03-31 ENCOUNTER — Ambulatory Visit (INDEPENDENT_AMBULATORY_CARE_PROVIDER_SITE_OTHER): Payer: BLUE CROSS/BLUE SHIELD | Admitting: Sports Medicine

## 2021-03-31 DIAGNOSIS — M76822 Posterior tibial tendinitis, left leg: Secondary | ICD-10-CM | POA: Diagnosis not present

## 2021-03-31 DIAGNOSIS — M4317 Spondylolisthesis, lumbosacral region: Secondary | ICD-10-CM

## 2021-03-31 DIAGNOSIS — M170 Bilateral primary osteoarthritis of knee: Secondary | ICD-10-CM

## 2021-03-31 MED ORDER — CELECOXIB 200 MG PO CAPS: ORAL_CAPSULE | ORAL | 2 refills | Status: DC

## 2021-03-31 NOTE — Telephone Encounter (Signed)
Patient called back after the appt to state that she has to go to [facility] for her PT. She has made an appt for the [facility] on [address] in [place] on 04/09/21. Please send referral there.

## 2021-03-31 NOTE — Addendum Note (Signed)
Addended by: Monica Becton on: 03/31/2021 03:58 PM   Modules accepted: Orders

## 2021-03-31 NOTE — Assessment & Plan Note (Signed)
Pleasant 55 year old female, chronic low back pain, axial, with radiation down the left leg to the bottom of the left foot in an L5 distribution. A CT scan did show L5-S1 spondylolisthesis, with severe L5-S1 facet osteoarthritis. Increasing celecoxib to 200 mg once to twice daily, adding formal physical therapy. She will hold off on methocarbamol for now. Return to see me in 6 weeks, MRI for epidural planning if not sufficiently better.

## 2021-03-31 NOTE — Assessment & Plan Note (Signed)
Has been doing well with custom molded orthotics, I did add a medial posting on the left orthotic. Return as needed for this.

## 2021-03-31 NOTE — Telephone Encounter (Signed)
Done

## 2021-03-31 NOTE — Progress Notes (Signed)
    Procedures performed today:    None.  Independent interpretation of notes and tests performed by another provider:   None.  Brief History, Exam, Impression, and Recommendations:    Spondylolisthesis at L5-S1 level Pleasant 55 year old female, chronic low back pain, axial, with radiation down the left leg to the bottom of the left foot in an L5 distribution. A CT scan did show L5-S1 spondylolisthesis, with severe L5-S1 facet osteoarthritis. Increasing celecoxib to 200 mg once to twice daily, adding formal physical therapy. She will hold off on methocarbamol for now. Return to see me in 6 weeks, MRI for epidural planning if not sufficiently better.  Tibialis posterior tendinitis, left Has been doing well with custom molded orthotics, I did add a medial posting on the left orthotic. Return as needed for this.  Primary osteoarthritis of both knees Most likely to her weight, she will discuss additional weight loss with her PCP. Increasing the dose of Celebrex should help, we can also try injections in the future if needed.    ___________________________________________ Ihor Austin. Benjamin Stain, M.D., ABFM., CAQSM. Primary Care and Sports Medicine [facility]  Adjunct Instructor of Family Medicine  [employer]

## 2021-03-31 NOTE — Assessment & Plan Note (Signed)
Most likely to her weight, she will discuss additional weight loss with her PCP. Increasing the dose of Celebrex should help, we can also try injections in the future if needed.

## 2021-04-06 ENCOUNTER — Encounter: Payer: Self-pay | Admitting: Medical-Surgical

## 2021-04-07 NOTE — Telephone Encounter (Addendum)
Medication: Plecanatide (TRULANCE) 3 MG TABS Prior authorization determination received Medication has been denied Reason for denial:  "This medication is approved for members who regularly require the use of laxative medications (such as bisacodyl, magnesium citrate, or polyethylene glycol) to have loose stools. In this case, the member does not regularly require laxative medications to have loose stools"  Appeal filed via [employer]

## 2021-04-07 NOTE — Telephone Encounter (Signed)
Medication: Plecanatide (TRULANCE) 3 MG TABS Prior authorization determination received Medication has been approved Approval dates: 04/06/2021-04/06/2022  Patient aware via: MyChart Pharmacy aware: Yes Provider aware via this encounter

## 2021-04-07 NOTE — Telephone Encounter (Signed)
I really do not know what she is talking about, we had a clear discussion in the office visit that she needed 6 weeks of aggressive physical therapy, we would then proceed with MRI and potentially injections if she was not sufficiently better.  If none of that helped after 3 injections then we would consider referral to a spine surgeon.  So we do not need a surgeon just yet, but considering her location we would probably use spine and scoliosis specialists off of [address] near [facility] if need be.  For now she just needs to focus on working hard in physical therapy.

## 2021-05-12 ENCOUNTER — Ambulatory Visit: Payer: BLUE CROSS/BLUE SHIELD | Admitting: Sports Medicine

## 2021-05-19 ENCOUNTER — Other Ambulatory Visit: Payer: Self-pay | Admitting: Medical-Surgical

## 2021-05-19 DIAGNOSIS — E039 Hypothyroidism, unspecified: Secondary | ICD-10-CM

## 2021-05-19 DIAGNOSIS — T148XXA Other injury of unspecified body region, initial encounter: Secondary | ICD-10-CM

## 2021-05-20 MED ORDER — METHOCARBAMOL 500 MG PO TABS: 500.0000 mg | ORAL_TABLET | Freq: Two times a day (BID) | ORAL | 0 refills | Status: DC

## 2021-05-24 ENCOUNTER — Other Ambulatory Visit: Payer: Self-pay | Admitting: Medical-Surgical

## 2021-05-24 DIAGNOSIS — E039 Hypothyroidism, unspecified: Secondary | ICD-10-CM

## 2021-05-24 NOTE — Telephone Encounter (Signed)
LVM for pt to return call to discuss.  She was supposed to have TSH recheck done mid-November d/t dosage change.  [name], CMA

## 2021-05-25 LAB — TSH: TSH: 4.46 mIU/L

## 2021-05-25 NOTE — Telephone Encounter (Signed)
Pt was in this morning to have her TSH drawn.  Will await results.  [name], CMA

## 2021-05-26 ENCOUNTER — Other Ambulatory Visit: Payer: Self-pay

## 2021-05-26 ENCOUNTER — Encounter: Payer: Self-pay | Admitting: Medical-Surgical

## 2021-05-26 MED ORDER — LEVOTHYROXINE SODIUM 75 MCG PO TABS: ORAL_TABLET | ORAL | 0 refills | Status: DC

## 2021-05-26 MED ORDER — LEVOTHYROXINE SODIUM 88 MCG PO TABS: ORAL_TABLET | ORAL | 0 refills | Status: DC

## 2021-06-12 ENCOUNTER — Other Ambulatory Visit: Payer: Self-pay | Admitting: Medical-Surgical

## 2021-06-20 ENCOUNTER — Other Ambulatory Visit: Payer: Self-pay | Admitting: Medical-Surgical

## 2021-06-20 DIAGNOSIS — E039 Hypothyroidism, unspecified: Secondary | ICD-10-CM

## 2021-06-21 ENCOUNTER — Encounter: Payer: Self-pay | Admitting: Medical-Surgical

## 2021-07-12 ENCOUNTER — Other Ambulatory Visit: Payer: Self-pay

## 2021-07-12 DIAGNOSIS — E039 Hypothyroidism, unspecified: Secondary | ICD-10-CM

## 2021-07-12 LAB — TSH: TSH: 1.91 mIU/L

## 2021-07-12 NOTE — Progress Notes (Signed)
tsh

## 2021-07-13 ENCOUNTER — Other Ambulatory Visit: Payer: Self-pay

## 2021-07-13 ENCOUNTER — Encounter: Payer: Self-pay | Admitting: Medical-Surgical

## 2021-07-13 DIAGNOSIS — E039 Hypothyroidism, unspecified: Secondary | ICD-10-CM

## 2021-07-13 MED ORDER — LEVOTHYROXINE SODIUM 75 MCG PO TABS: ORAL_TABLET | ORAL | 0 refills | Status: DC

## 2021-07-13 MED ORDER — LEVOTHYROXINE SODIUM 88 MCG PO TABS: ORAL_TABLET | ORAL | 0 refills | Status: DC

## 2021-07-15 ENCOUNTER — Other Ambulatory Visit: Payer: Self-pay | Admitting: Medical-Surgical

## 2021-07-15 DIAGNOSIS — T148XXA Other injury of unspecified body region, initial encounter: Secondary | ICD-10-CM

## 2021-07-16 ENCOUNTER — Encounter: Payer: Self-pay | Admitting: Medical-Surgical

## 2021-07-16 DIAGNOSIS — T148XXA Other injury of unspecified body region, initial encounter: Secondary | ICD-10-CM

## 2021-07-16 MED ORDER — BUPROPION HCL ER (XL) 150 MG PO TB24: 150.0000 mg | ORAL_TABLET | ORAL | 0 refills | Status: DC

## 2021-07-16 MED ORDER — METHOCARBAMOL 500 MG PO TABS: 500.0000 mg | ORAL_TABLET | Freq: Two times a day (BID) | ORAL | 0 refills | Status: DC

## 2021-07-20 ENCOUNTER — Other Ambulatory Visit: Payer: Self-pay | Admitting: Medical-Surgical

## 2021-07-20 DIAGNOSIS — G894 Chronic pain syndrome: Secondary | ICD-10-CM

## 2021-09-24 ENCOUNTER — Other Ambulatory Visit: Payer: Self-pay | Admitting: Sports Medicine

## 2021-09-24 DIAGNOSIS — M4317 Spondylolisthesis, lumbosacral region: Secondary | ICD-10-CM

## 2021-10-07 ENCOUNTER — Other Ambulatory Visit: Payer: Self-pay | Admitting: Medical-Surgical

## 2021-10-07 DIAGNOSIS — E039 Hypothyroidism, unspecified: Secondary | ICD-10-CM

## 2021-10-20 ENCOUNTER — Other Ambulatory Visit: Payer: Self-pay | Admitting: Medical-Surgical

## 2021-10-20 DIAGNOSIS — E039 Hypothyroidism, unspecified: Secondary | ICD-10-CM

## 2021-10-21 ENCOUNTER — Encounter: Payer: Self-pay | Admitting: Medical-Surgical

## 2021-10-21 ENCOUNTER — Other Ambulatory Visit: Payer: Self-pay | Admitting: Medical-Surgical

## 2021-10-21 DIAGNOSIS — E039 Hypothyroidism, unspecified: Secondary | ICD-10-CM

## 2021-10-22 ENCOUNTER — Other Ambulatory Visit: Payer: Self-pay | Admitting: Medical-Surgical

## 2021-10-22 DIAGNOSIS — E039 Hypothyroidism, unspecified: Secondary | ICD-10-CM

## 2021-10-25 ENCOUNTER — Other Ambulatory Visit: Payer: Self-pay | Admitting: Medical-Surgical

## 2021-10-25 DIAGNOSIS — E039 Hypothyroidism, unspecified: Secondary | ICD-10-CM

## 2021-11-01 ENCOUNTER — Other Ambulatory Visit: Payer: Self-pay | Admitting: Physician Assistant

## 2021-11-12 NOTE — Telephone Encounter (Signed)
Called patient and suggested virtual urgent care. We are booked today.

## 2021-11-16 IMAGING — US US ABDOMEN LIMITED
1 series · 14 of 25 positions shown · non-contrast
Comparison: Limited abdominal ultrasound 11/26/2019.

CLINICAL DATA: Abnormal liver enzymes.

EXAM:
ULTRASOUND ABDOMEN LIMITED RIGHT UPPER QUADRANT

[Series 1: us abdomen limited ruq (liver/gb) · 14 of 60 slices shown]
[im 1/60]
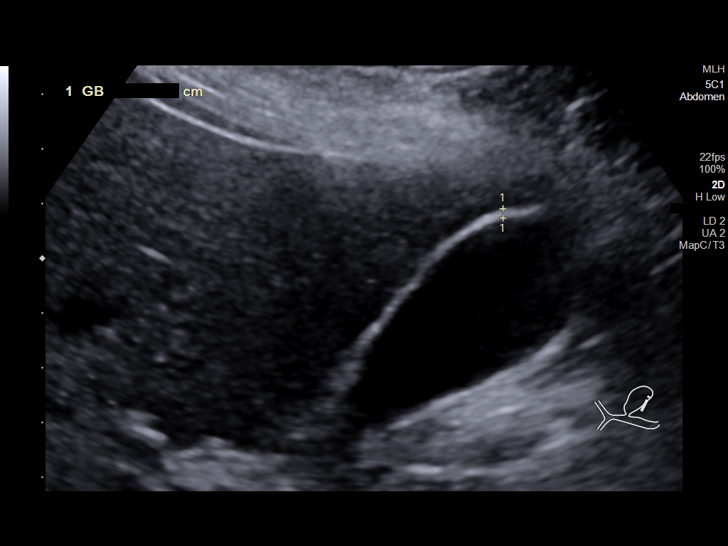
[im 5/60]
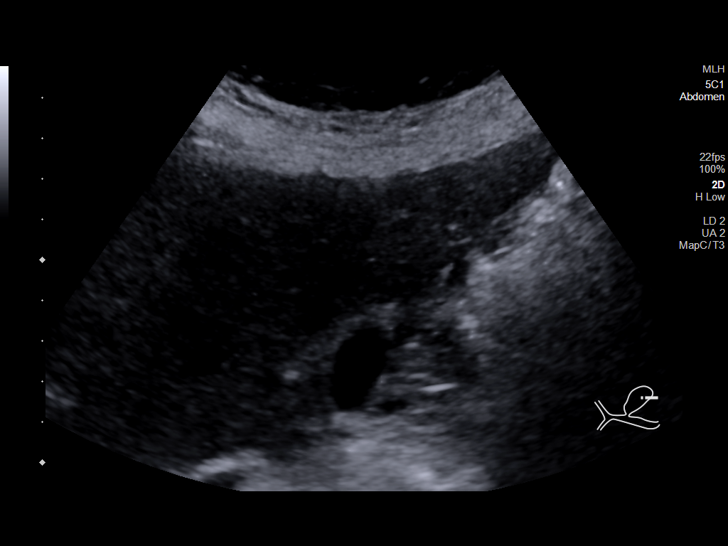
[im 10/60]
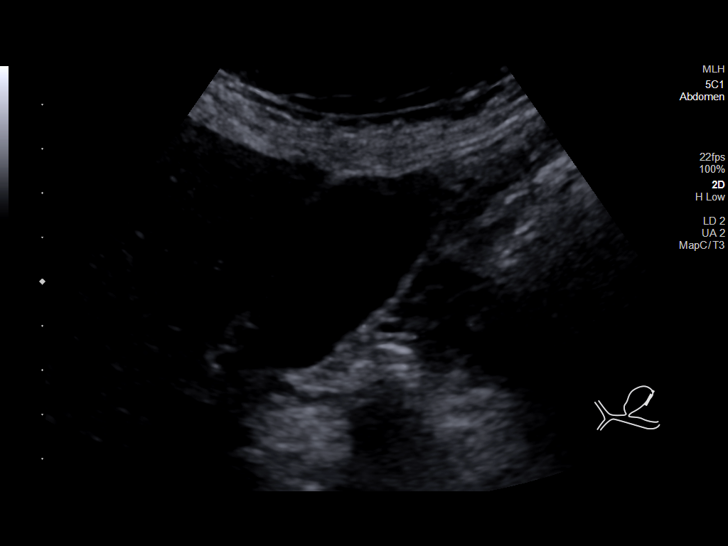
[im 15/60]
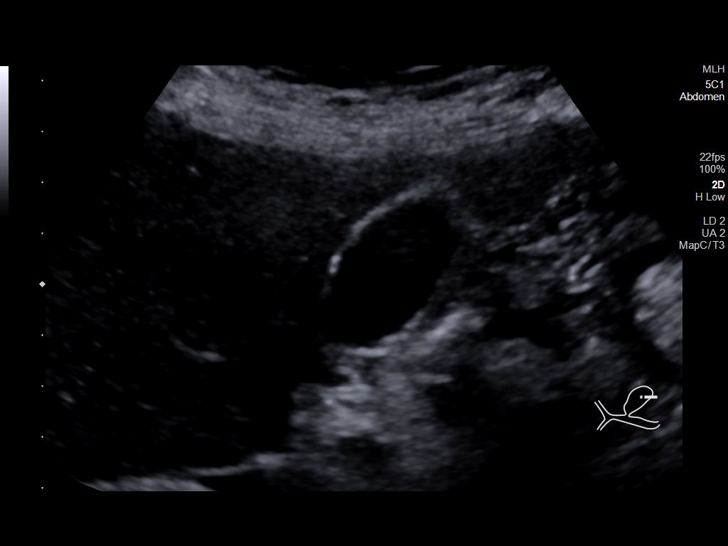
[im 20/60]
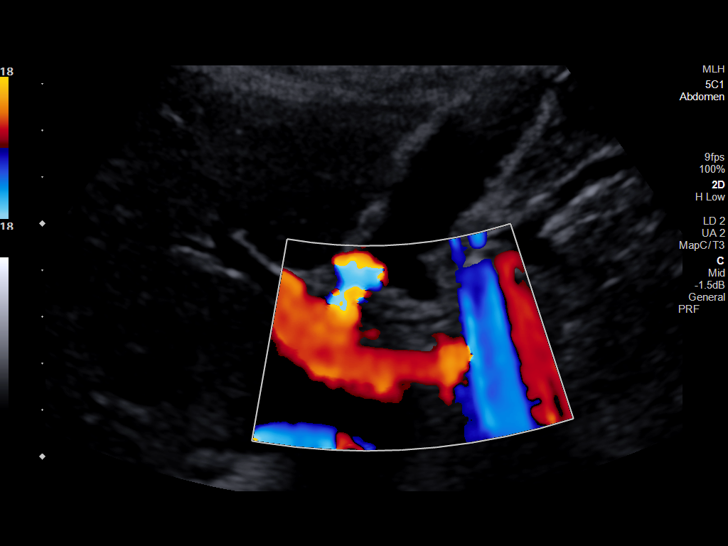
[im 23/60]
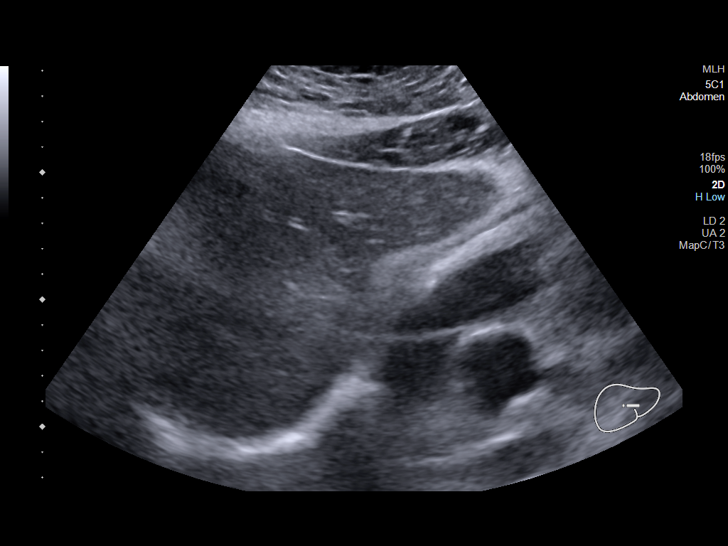
[im 28/60]
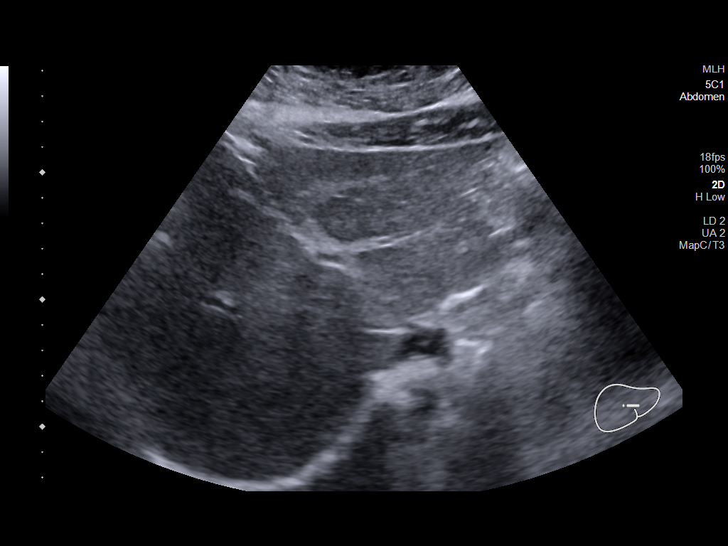
[im 32/60]
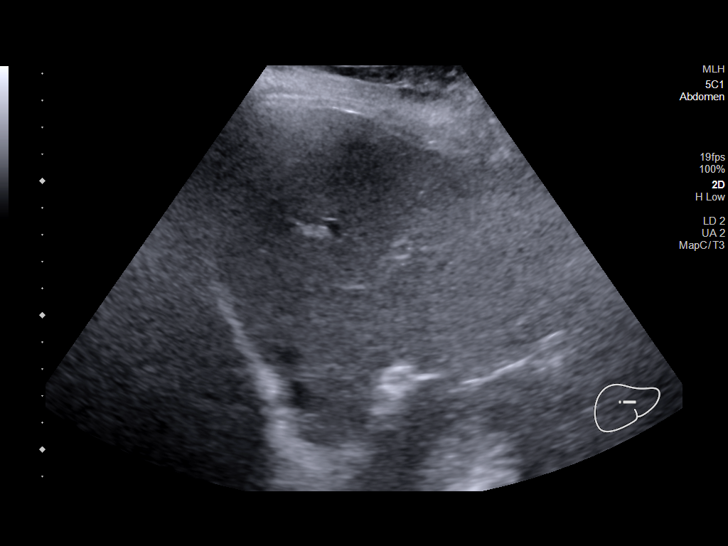
[im 37/60]
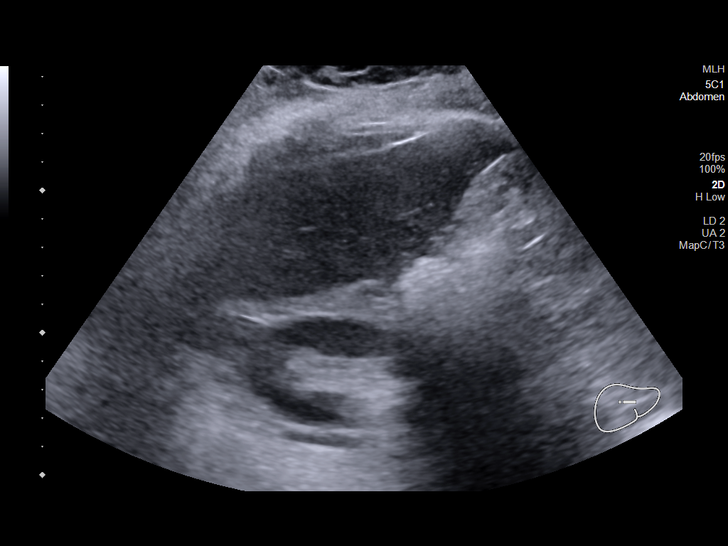
[im 40/60]
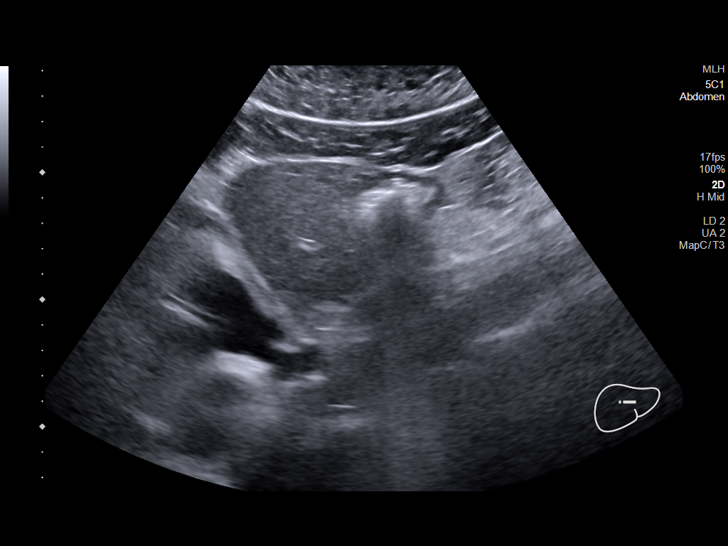
[im 45/60]
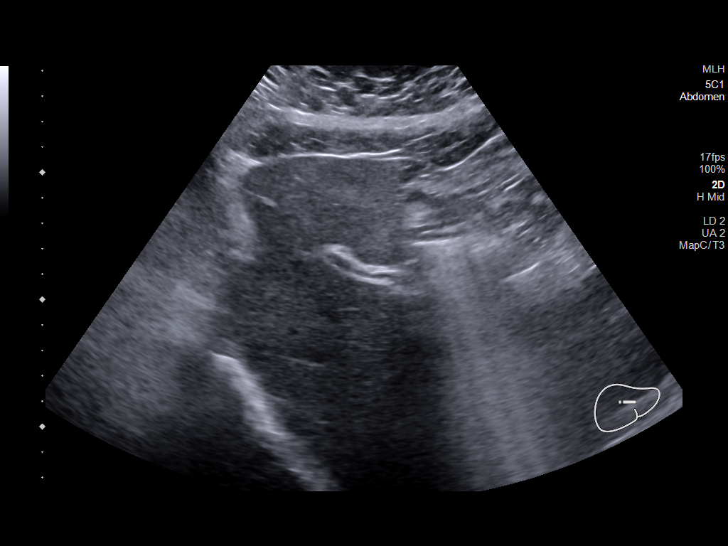
[im 50/60]
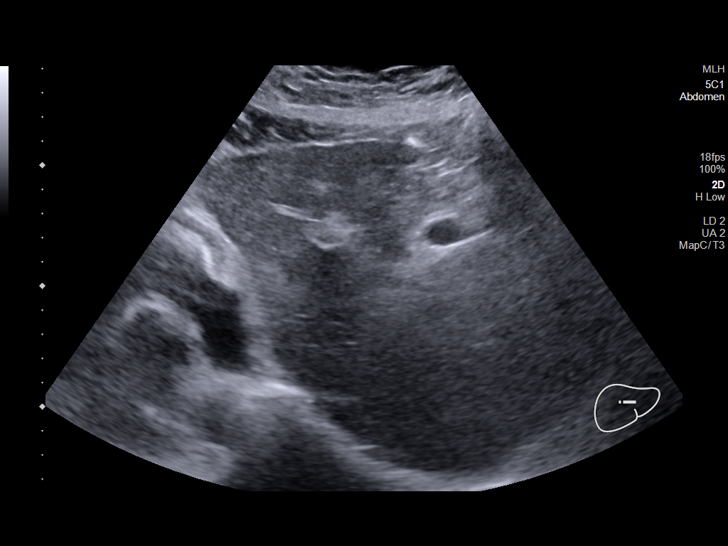
[im 55/60]
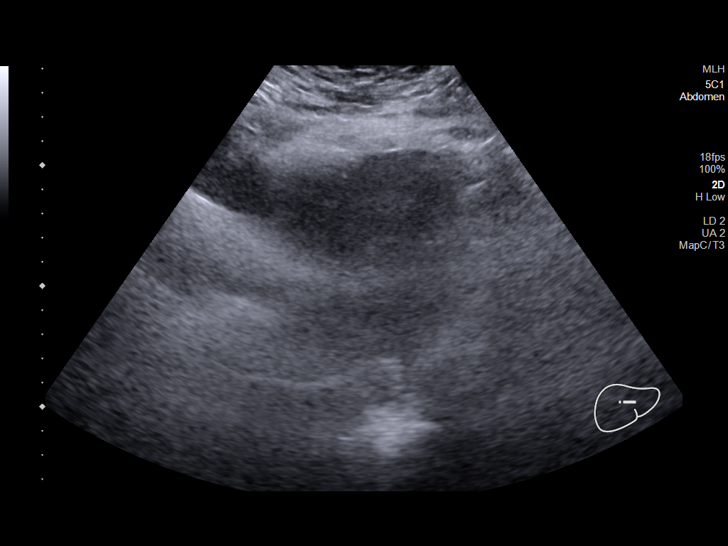
[im 60/60]
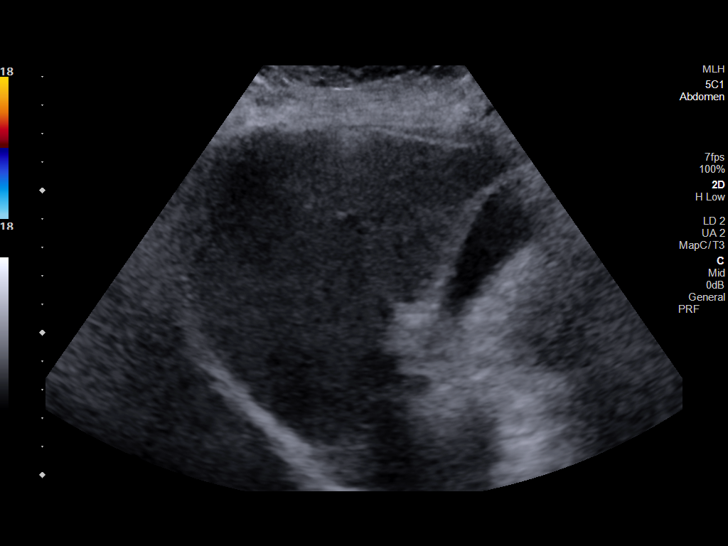

[14 of 25 positions shown; findings below may reference images not displayed]

FINDINGS: Gallbladder:

No gallstones or wall thickening visualized. No sonographic Murphy
sign noted by sonographer.

Common bile duct:

Not visualized

Liver:

No focal lesion identified. Increased parenchymal echogenicity.
Portal vein is patent on color Doppler imaging with normal direction
of blood flow towards the liver.

Other: None.
IMPRESSION: 1. Stable fatty infiltration of the liver.
2. No cholelithiasis.  Common bile duct not visualized.

## 2021-11-18 ENCOUNTER — Ambulatory Visit: Payer: BLUE CROSS/BLUE SHIELD | Admitting: Medical-Surgical

## 2021-11-21 IMAGING — CT CT ABD-PELV W/O CM
2 of 4 series · 16 of 46 positions shown, 18 images · non-contrast
Comparison: Right upper quadrant ultrasound on 03/17/2021

CLINICAL DATA: Right flank pain and right upper quadrant abdominal
pain.

EXAM:
CT ABDOMEN AND PELVIS WITHOUT CONTRAST
TECHNIQUE: Multidetector CT imaging of the abdomen and pelvis was performed
following the standard protocol without IV contrast.

[Series 2: axial st · axial · 0.71mm/px · z∈[+849,+1229]mm · 13 of 84 slices shown, 15 images]
[im 4/84  soft-tissue]
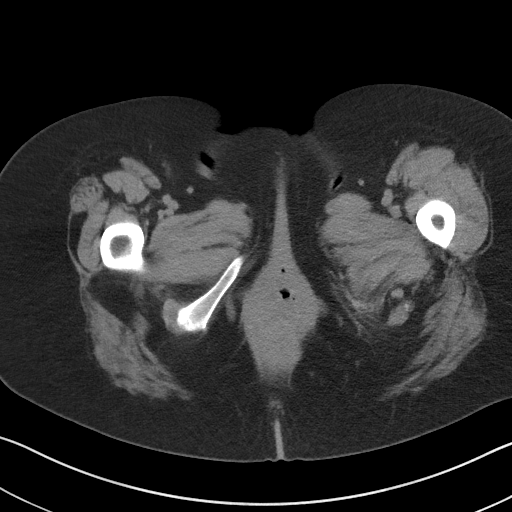
[im 4/84  bone]
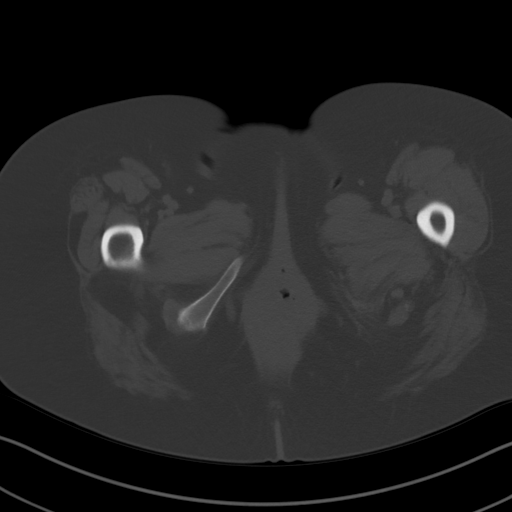
[im 11/84  soft-tissue]
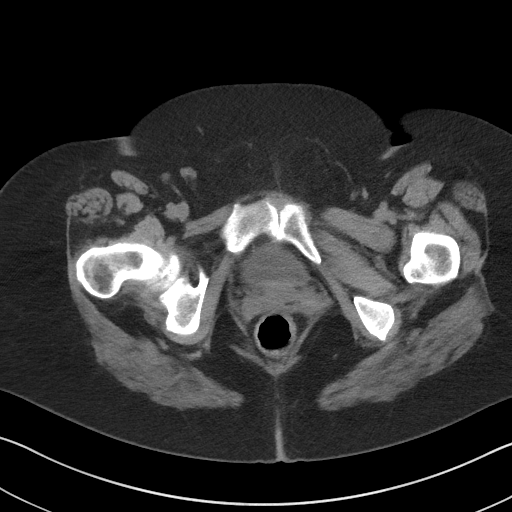
[im 18/84  soft-tissue]
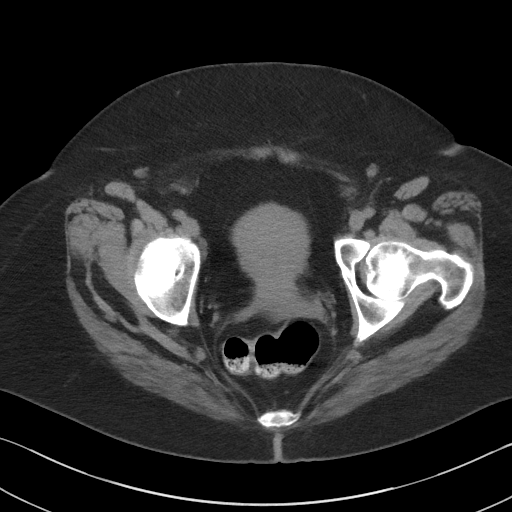
[im 25/84  soft-tissue]
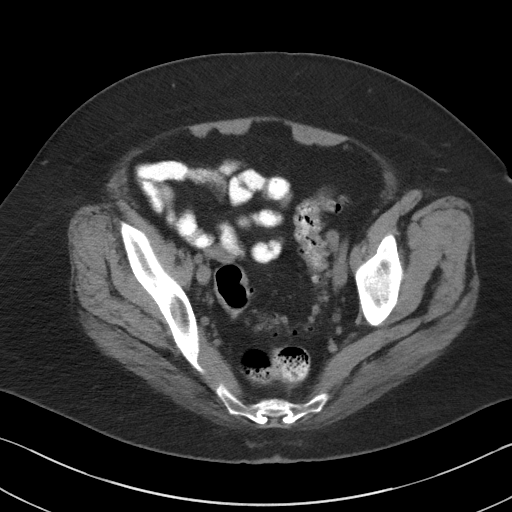
[im 28/84  soft-tissue]
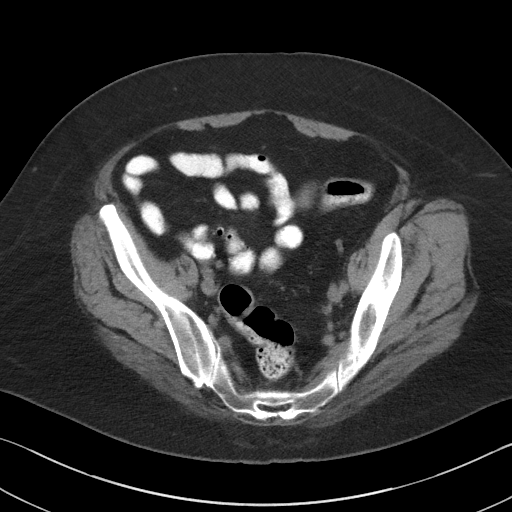
[im 35/84  soft-tissue]
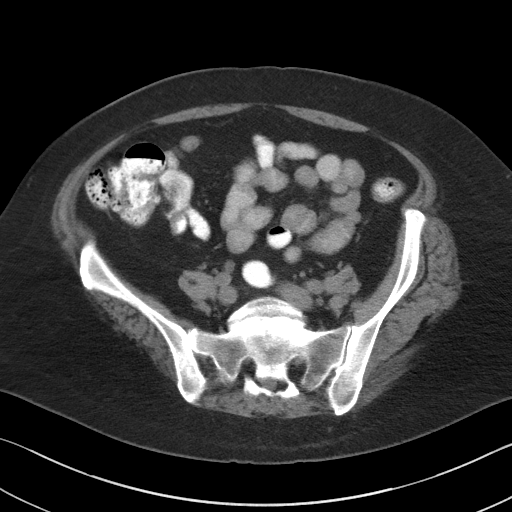
[im 42/84  soft-tissue]
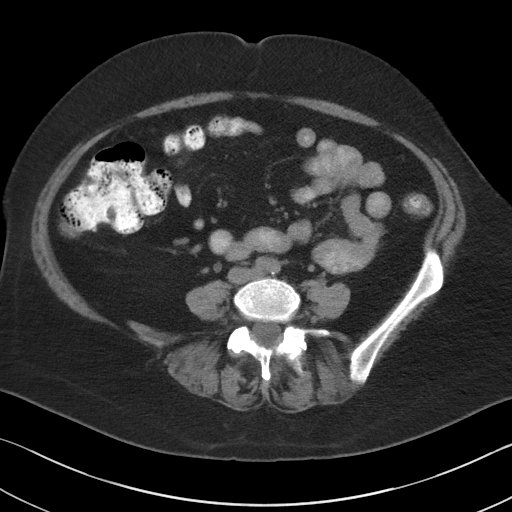
[im 49/84  soft-tissue]
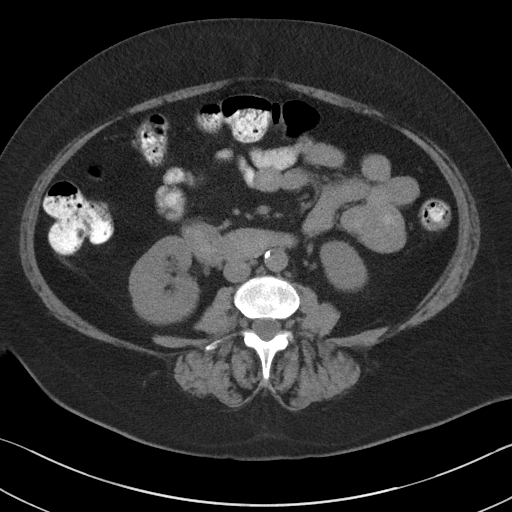
[im 56/84  soft-tissue]
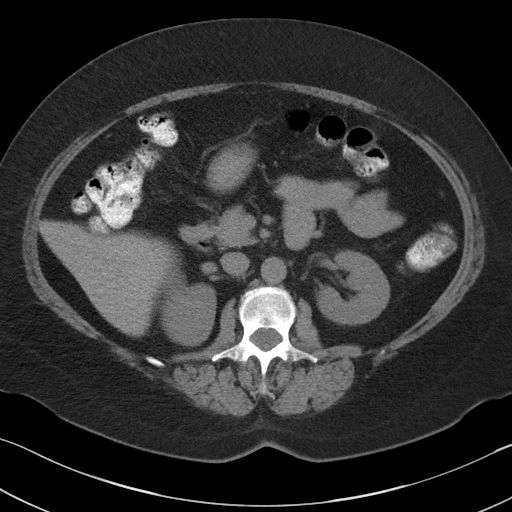
[im 56/84  bone]
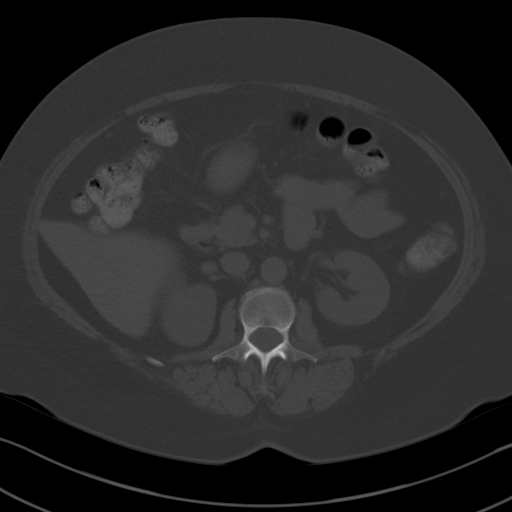
[im 59/84  soft-tissue]
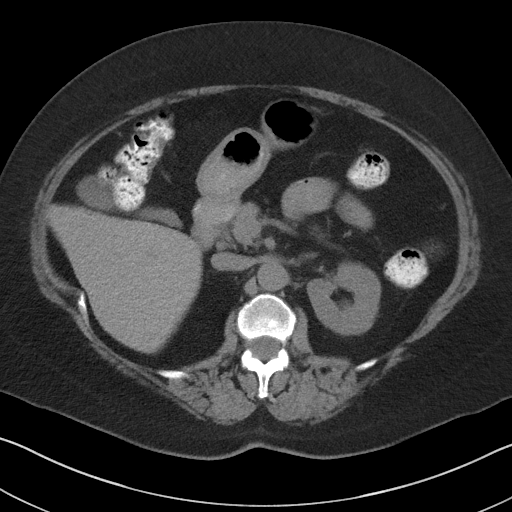
[im 66/84  soft-tissue]
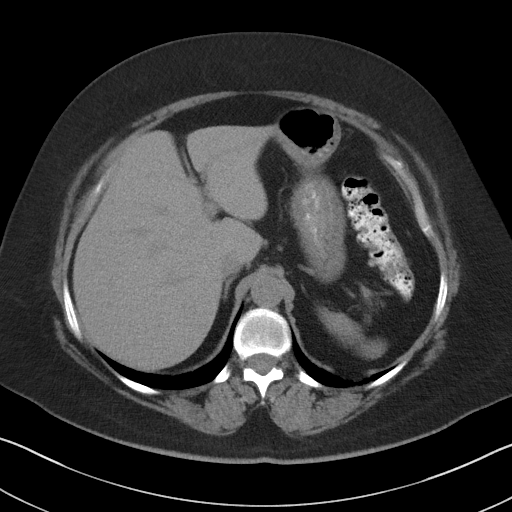
[im 73/84  soft-tissue]
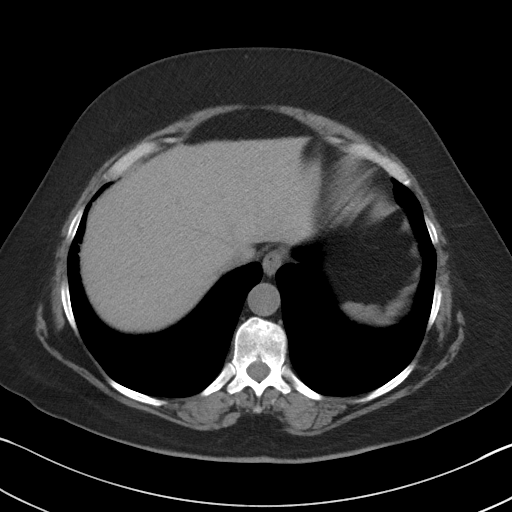
[im 80/84  soft-tissue]
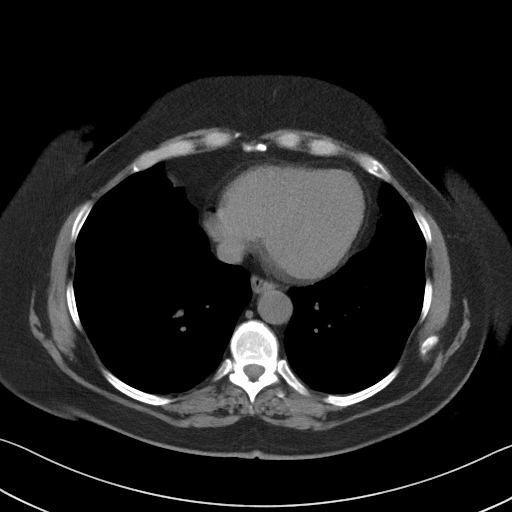

[Series 5: coronal st · coronal · 0.73mm/px · 3 of 87 slices shown]
[im 29/87  soft-tissue]
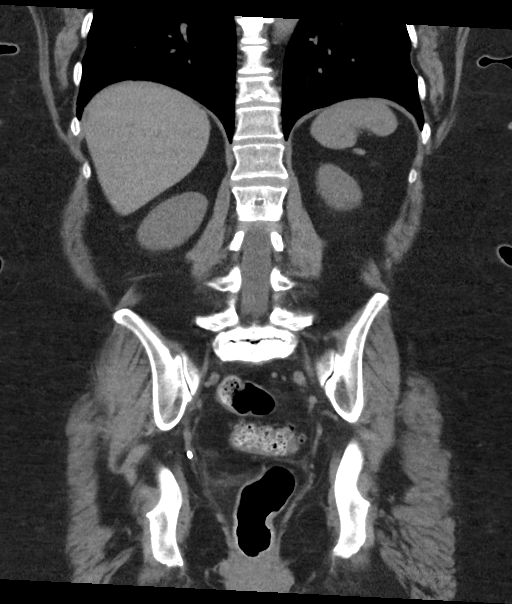
[im 39/87  soft-tissue]
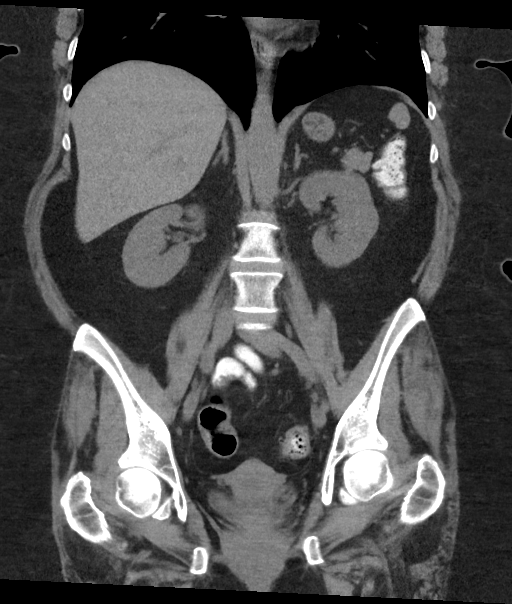
[im 48/87  soft-tissue]
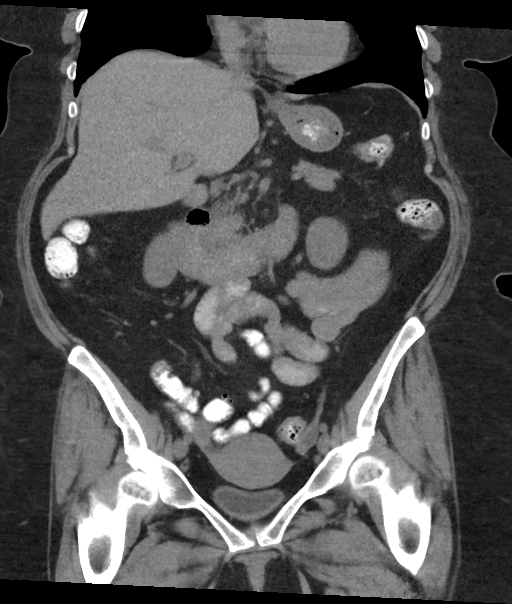

[16 of 46 positions shown; findings below may reference images not displayed]

FINDINGS: Lower chest: No acute abnormality.

Hepatobiliary: No focal liver abnormality is seen. No gallstones,
gallbladder wall thickening, or biliary dilatation.

Pancreas: Unremarkable. No pancreatic ductal dilatation or
surrounding inflammatory changes.

Spleen: Normal in size without focal abnormality.

Adrenals/Urinary Tract: Adrenal glands are unremarkable. Kidneys are
normal, without renal calculi, focal lesion, or hydronephrosis.
Bladder is unremarkable.

Stomach/Bowel: Bowel shows no evidence of obstruction, ileus,
inflammation or lesion. The appendix appears to have been removed.
No free intraperitoneal air is identified. Mild diverticulosis of
the sigmoid colon without evidence of acute diverticulitis.

Vascular/Lymphatic: Minimal calcified plaque in the distal abdominal
aorta without evidence of aneurysm. No enlarged abdominal or pelvic
lymph nodes.

Reproductive: Uterus and bilateral adnexa are unremarkable.

Other: No abdominal wall hernia or abnormality. No abdominopelvic
ascites.

Musculoskeletal: Moderate degenerative disc disease at L5-S1 with
disc space narrowing, vacuum disc and associated 8 mm
anterolisthesis of L5 on S1. Appearance is also suggestive of
underlying bilateral chronic L5 pars defects.
IMPRESSION: 1. No acute findings in the abdomen or pelvis. No evidence of renal
calculi or hepatobiliary abnormalities.
2. Sigmoid diverticulosis without diverticulitis.
3. Minimal atherosclerosis of the distal abdominal aorta.
4. Moderate degenerative disc disease at L5-S1 with associated 8 mm
anterolisthesis of L5 on S1. There may be chronic bilateral pars
defects at L5.

## 2021-12-01 ENCOUNTER — Other Ambulatory Visit: Payer: Self-pay | Admitting: Medical-Surgical

## 2021-12-08 LAB — TSH: TSH: 14.47 mIU/L — ABNORMAL HIGH

## 2021-12-10 ENCOUNTER — Encounter: Payer: Self-pay | Admitting: Medical-Surgical

## 2021-12-10 DIAGNOSIS — E039 Hypothyroidism, unspecified: Secondary | ICD-10-CM

## 2021-12-10 MED ORDER — LEVOTHYROXINE SODIUM 88 MCG PO TABS: 88.0000 ug | ORAL_TABLET | Freq: Every day | ORAL | 3 refills | Status: DC

## 2022-01-04 ENCOUNTER — Other Ambulatory Visit: Payer: Self-pay | Admitting: Medical-Surgical

## 2022-01-04 DIAGNOSIS — G894 Chronic pain syndrome: Secondary | ICD-10-CM

## 2022-02-05 ENCOUNTER — Other Ambulatory Visit: Payer: Self-pay | Admitting: Medical-Surgical

## 2022-02-07 NOTE — Telephone Encounter (Signed)
Patient scheduled.

## 2022-02-07 NOTE — Telephone Encounter (Signed)
Last office visit 03/17/2021  Canceled 11/18/2021  Last filled 11/01/2021  30 day supply sent to the pharmacy. No refill.

## 2022-02-09 ENCOUNTER — Other Ambulatory Visit: Payer: BLUE CROSS/BLUE SHIELD

## 2022-02-10 ENCOUNTER — Other Ambulatory Visit: Payer: Self-pay | Admitting: Medical-Surgical

## 2022-02-10 DIAGNOSIS — E039 Hypothyroidism, unspecified: Secondary | ICD-10-CM

## 2022-02-10 LAB — TSH: TSH: 19 mIU/L — ABNORMAL HIGH

## 2022-02-10 MED ORDER — LEVOTHYROXINE SODIUM 100 MCG PO TABS: 100.0000 ug | ORAL_TABLET | Freq: Every day | ORAL | 0 refills | Status: DC

## 2022-03-05 ENCOUNTER — Other Ambulatory Visit: Payer: Self-pay | Admitting: Medical-Surgical

## 2022-03-07 ENCOUNTER — Ambulatory Visit: Payer: BLUE CROSS/BLUE SHIELD | Admitting: Medical-Surgical

## 2022-03-07 NOTE — Telephone Encounter (Signed)
Patient had an appointment today and rescheduled to October.  Last office visit 03/17/2021  Last filled 02/07/2022

## 2022-03-07 NOTE — Telephone Encounter (Signed)
30 day supply sent. Will need follow up for any further refills.

## 2022-03-08 ENCOUNTER — Other Ambulatory Visit: Payer: Self-pay | Admitting: Medical-Surgical

## 2022-03-08 DIAGNOSIS — G894 Chronic pain syndrome: Secondary | ICD-10-CM

## 2022-03-09 NOTE — Telephone Encounter (Signed)
Last office visit 03/17/2021  Canceled 11/18/2021  Canceled 03/07/2022  Canceled 03/21/2022  Future Appointment 03/31/2022  Last filled 01/04/2022  Is it okay to refill medication till next appointment?

## 2022-03-09 NOTE — Telephone Encounter (Signed)
Refills have been provided several times to get her to her scheduled follow-up appointment but she has canceled 3 appointments in a row.  She will need to attend an appointment before we are able to refill this medication.

## 2022-03-15 ENCOUNTER — Encounter: Payer: BLUE CROSS/BLUE SHIELD | Admitting: Medical-Surgical

## 2022-03-15 NOTE — Progress Notes (Unsigned)
Virtual Visit via Video Note  I connected with Tiffany Best on 03/15/22 at 11:10 AM EDT by a video enabled telemedicine application and verified that I am speaking with the correct person using two identifiers.   I discussed the limitations of evaluation and management by telemedicine and the availability of in person appointments. The patient expressed understanding and agreed to proceed.  Patient location: home Provider locations: office  Subjective:    CC:   HPI:    Past medical history, Surgical history, Family history not pertinant except as noted below, Social history, Allergies, and medications have been entered into the medical record, reviewed, and corrections made.   Review of Systems: See HPI for pertinent positives and negatives.   Objective:    General: Speaking clearly in complete sentences without any shortness of breath.  Alert and oriented x3.  Normal judgment. No apparent acute distress.  Impression and Recommendations:    No problem-specific Assessment & Plan notes found for this encounter.   I discussed the assessment and treatment plan with the patient. The patient was provided an opportunity to ask questions and all were answered. The patient agreed with the plan and demonstrated an understanding of the instructions.   The patient was advised to call back or seek an in-person evaluation if the symptoms worsen or if the condition fails to improve as anticipated.  *** minutes of non-face-to-face time was provided during this encounter.  No follow-ups on file.  Clearnce Sorrel, DNP, APRN, FNP-BC [facility]

## 2022-03-20 NOTE — Progress Notes (Unsigned)
Virtual Visit via Video Note  I connected with Tiffany Best on 03/20/22 at 11:10 AM EDT by a video enabled telemedicine application and verified that I am speaking with the correct person using two identifiers.   I discussed the limitations of evaluation and management by telemedicine and the availability of in person appointments. The patient expressed understanding and agreed to proceed.  Patient location: home Provider locations: office  Subjective:    CC: medication refill  HPI:    Past medical history, Surgical history, Family history not pertinant except as noted below, Social history, Allergies, and medications have been entered into the medical record, reviewed, and corrections made.   Review of Systems: See HPI for pertinent positives and negatives.   Objective:    General: Speaking clearly in complete sentences without any shortness of breath.  Alert and oriented x3.  Normal judgment. No apparent acute distress.  Impression and Recommendations:    No problem-specific Assessment & Plan notes found for this encounter.   I discussed the assessment and treatment plan with the patient. The patient was provided an opportunity to ask questions and all were answered. The patient agreed with the plan and demonstrated an understanding of the instructions.   The patient was advised to call back or seek an in-person evaluation if the symptoms worsen or if the condition fails to improve as anticipated.  *** minutes of non-face-to-face time was provided during this encounter.  No follow-ups on file.  Clearnce Sorrel, DNP, APRN, FNP-BC [facility]

## 2022-03-21 ENCOUNTER — Ambulatory Visit: Payer: BLUE CROSS/BLUE SHIELD | Admitting: Medical-Surgical

## 2022-03-21 ENCOUNTER — Encounter: Payer: BLUE CROSS/BLUE SHIELD | Admitting: Medical-Surgical

## 2022-03-21 DIAGNOSIS — Z124 Encounter for screening for malignant neoplasm of cervix: Secondary | ICD-10-CM

## 2022-03-21 DIAGNOSIS — Z716 Tobacco abuse counseling: Secondary | ICD-10-CM

## 2022-03-21 DIAGNOSIS — Z1211 Encounter for screening for malignant neoplasm of colon: Secondary | ICD-10-CM

## 2022-03-21 DIAGNOSIS — Z23 Encounter for immunization: Secondary | ICD-10-CM

## 2022-03-21 DIAGNOSIS — E782 Mixed hyperlipidemia: Secondary | ICD-10-CM

## 2022-03-21 DIAGNOSIS — Z1231 Encounter for screening mammogram for malignant neoplasm of breast: Secondary | ICD-10-CM

## 2022-03-21 DIAGNOSIS — Z0000 Encounter for general adult medical examination without abnormal findings: Secondary | ICD-10-CM

## 2022-03-29 ENCOUNTER — Other Ambulatory Visit: Payer: Self-pay | Admitting: Medical-Surgical

## 2022-03-29 DIAGNOSIS — G894 Chronic pain syndrome: Secondary | ICD-10-CM

## 2022-03-29 NOTE — Telephone Encounter (Signed)
Patient needs appointment for further refills.  Last office visit 03/17/2021  Canceled 03/31/2022  Last filled 01/04/2022  Sent 30 day supply. No refills

## 2022-03-29 NOTE — Telephone Encounter (Signed)
Lvm for the patient to call back to make an appointment to schedule for further refills- tvt

## 2022-03-31 ENCOUNTER — Ambulatory Visit: Payer: BLUE CROSS/BLUE SHIELD | Admitting: Medical-Surgical

## 2022-03-31 ENCOUNTER — Other Ambulatory Visit: Payer: Self-pay | Admitting: Medical-Surgical

## 2022-04-13 ENCOUNTER — Other Ambulatory Visit: Payer: Self-pay | Admitting: Medical-Surgical

## 2022-04-13 NOTE — Telephone Encounter (Signed)
Pt stated that she wasn't feeling up to it, and she will call back when she is able. - tvt

## 2022-04-13 NOTE — Telephone Encounter (Signed)
Patient needs an appointment for further refills on the Wellbutrin.  Sent 15 day supply to the pharmacy. No refill.

## 2022-04-25 ENCOUNTER — Telehealth: Payer: Self-pay | Admitting: Medical-Surgical

## 2022-04-25 NOTE — Telephone Encounter (Signed)
Made in error

## 2022-04-27 ENCOUNTER — Other Ambulatory Visit: Payer: Self-pay | Admitting: Medical-Surgical

## 2022-05-03 ENCOUNTER — Other Ambulatory Visit: Payer: BLUE CROSS/BLUE SHIELD

## 2022-05-04 LAB — TSH: TSH: 14.62 mIU/L — ABNORMAL HIGH

## 2022-05-07 ENCOUNTER — Other Ambulatory Visit: Payer: Self-pay | Admitting: Medical-Surgical

## 2022-05-07 DIAGNOSIS — G894 Chronic pain syndrome: Secondary | ICD-10-CM

## 2022-05-08 ENCOUNTER — Other Ambulatory Visit: Payer: Self-pay | Admitting: Medical-Surgical

## 2022-05-08 DIAGNOSIS — E039 Hypothyroidism, unspecified: Secondary | ICD-10-CM

## 2022-05-09 MED ORDER — LEVOTHYROXINE SODIUM 125 MCG PO TABS: 125.0000 ug | ORAL_TABLET | Freq: Every day | ORAL | 1 refills | Status: DC

## 2022-05-09 NOTE — Addendum Note (Signed)
Addended byChristen Butter on: 05/09/2022 01:01 PM   Modules accepted: Orders

## 2022-05-09 NOTE — Progress Notes (Signed)
TSH still elevated. Increasing levothyroxine to daily. New prescription sent to the pharmacy on file. Plan to recheck TSH in 6-8 weeks. ___________________________________________ Thayer Ohm, DNP, APRN, FNP-BC [facility] [place]

## 2022-05-10 ENCOUNTER — Other Ambulatory Visit: Payer: Self-pay

## 2022-05-10 MED ORDER — BUPROPION HCL ER (XL) 150 MG PO TB24: 150.0000 mg | ORAL_TABLET | ORAL | 0 refills | Status: DC

## 2022-05-12 NOTE — Progress Notes (Signed)
Patient advised.

## 2022-06-01 ENCOUNTER — Other Ambulatory Visit: Payer: Self-pay | Admitting: Medical-Surgical

## 2022-06-06 ENCOUNTER — Other Ambulatory Visit: Payer: Self-pay | Admitting: Medical-Surgical

## 2022-06-06 DIAGNOSIS — G894 Chronic pain syndrome: Secondary | ICD-10-CM

## 2022-06-14 ENCOUNTER — Other Ambulatory Visit: Payer: Self-pay | Admitting: Medical-Surgical

## 2022-06-15 ENCOUNTER — Other Ambulatory Visit: Payer: Self-pay | Admitting: Medical-Surgical

## 2022-06-22 ENCOUNTER — Encounter: Payer: Self-pay | Admitting: Medical-Surgical

## 2022-06-22 ENCOUNTER — Telehealth (INDEPENDENT_AMBULATORY_CARE_PROVIDER_SITE_OTHER): Payer: Medicaid Other | Admitting: Medical-Surgical

## 2022-06-22 DIAGNOSIS — J40 Bronchitis, not specified as acute or chronic: Secondary | ICD-10-CM

## 2022-06-22 DIAGNOSIS — J329 Chronic sinusitis, unspecified: Secondary | ICD-10-CM | POA: Diagnosis not present

## 2022-06-22 DIAGNOSIS — E039 Hypothyroidism, unspecified: Secondary | ICD-10-CM | POA: Diagnosis not present

## 2022-06-22 MED ORDER — AZITHROMYCIN 250 MG PO TABS: ORAL_TABLET | ORAL | 0 refills | Status: AC

## 2022-06-22 MED ORDER — PREDNISONE 50 MG PO TABS: 50.0000 mg | ORAL_TABLET | Freq: Every day | ORAL | 0 refills | Status: DC

## 2022-06-22 NOTE — Progress Notes (Signed)
Virtual Visit via Video Note  I connected with Tiffany Best on 06/22/22 at 11:10 AM EST by a video enabled telemedicine application and verified that I am speaking with the correct person using two identifiers.   I discussed the limitations of evaluation and management by telemedicine and the availability of in person appointments. The patient expressed understanding and agreed to proceed.  Patient location: home Provider locations: office  Subjective:    CC: Congestion  HPI: Pleasant 57 year old female presenting via MyChart video visit to discuss upper respiratory symptoms.  She reports that she came to our office on the Wednesday before Thanksgiving to have labs drawn.  On Friday she did not feel well and ended up testing positive for COVID.  Reports that she does not go anywhere else so she just knows that she got it at our office.  Since then, she underwent the expected duration and course of the illness.  Today she reports that she never completely resolves covered from her symptoms.  She continues to have sinus congestion but notes that it is in her left side of the neck rather than her chest.  Does admit to maxillary facial pressure and discomfort.  Continues to have a cough but reports that all phlegm and nasal discharge is clear although thicker than usual.  Her cough is worse in the morning and in the evening.  Feels extremely fatigued and has no energy.  Over the course of her illness, she had nausea and vomiting once but this has fully resolved.  Notes that she was in a cold sweat yesterday morning and then again today.  Has been having trouble sleeping.  Has been sleeping in a recliner because now she reports that she is having increased back pain.  She is using a heating pad to help with this.  Also using ibuprofen and Mucinex on an as-needed basis.  It has been greater than 1 year since she has been in our office for an in person visit.  During our appointment, discussed the need for  safe prescribing and an in person office visit no less than once per year.  She became very tearful and upset that she would have to come into our office.  She endorses being very scared because she caught COVID.  Also reports that she feels like she needs to be so much stronger and have more energy before she can come into our office.  During this conversation she then asked if we could manage her depression and prescription NSAID medications virtually.   Past medical history, Surgical history, Family history not pertinant except as noted below, Social history, Allergies, and medications have been entered into the medical record, reviewed, and corrections made.   Review of Systems: See HPI for pertinent positives and negatives.   Objective:    General: Speaking clearly in complete sentences without any shortness of breath.  Alert and oriented x3.  Normal judgment. No apparent acute distress.  Impression and Recommendations:    1. Sinobronchitis She has had 4 weeks of symptoms and although she has had some improvement, she does still have significant maxillary sinus pressure and discomfort.  Will go ahead and treat with azithromycin and a burst of prednisone to help resolve her symptoms fully.  If this does not get rid of her lingering symptoms, advised her that she will need to come in our office for an in person exam. - azithromycin (ZITHROMAX) 250 MG tablet; Take 2 tablets on day 1, then 1 tablet daily  on days 2 through 5  Dispense: 6 tablet; Refill: 0 - predniSONE (DELTASONE) 50 MG tablet; Take 1 tablet (50 mg total) by mouth daily.  Dispense: 5 tablet; Refill: 0  2.  Hypothyroidism She will be due for recheck of her thyroid in another 2 to 4 weeks.  Advised that we need her to come in the office for this follow-up to make sure we are prescribing safely.  Explained the need for safe prescribing and close monitoring when on multiple medications.  At that time, we can address any other issues  that may be pertinent.  I discussed the assessment and treatment plan with the patient. The patient was provided an opportunity to ask questions and all were answered. The patient agreed with the plan and demonstrated an understanding of the instructions.   The patient was advised to call back or seek an in-person evaluation if the symptoms worsen or if the condition fails to improve as anticipated.  25 minutes of non-face-to-face time was provided during this encounter.  Return in about 4 weeks (around 07/20/2022) for Hypothyroidism follow-up.  Clearnce Sorrel, DNP, APRN, FNP-BC [facility]

## 2022-07-24 ENCOUNTER — Other Ambulatory Visit: Payer: Self-pay | Admitting: Medical-Surgical

## 2022-07-24 DIAGNOSIS — E039 Hypothyroidism, unspecified: Secondary | ICD-10-CM

## 2022-08-31 ENCOUNTER — Other Ambulatory Visit: Payer: Self-pay | Admitting: Medical-Surgical

## 2022-08-31 DIAGNOSIS — G894 Chronic pain syndrome: Secondary | ICD-10-CM

## 2022-08-31 DIAGNOSIS — E039 Hypothyroidism, unspecified: Secondary | ICD-10-CM

## 2022-09-28 ENCOUNTER — Other Ambulatory Visit: Payer: Self-pay | Admitting: Medical-Surgical

## 2022-09-28 DIAGNOSIS — G894 Chronic pain syndrome: Secondary | ICD-10-CM

## 2022-09-28 DIAGNOSIS — E039 Hypothyroidism, unspecified: Secondary | ICD-10-CM

## 2022-10-06 ENCOUNTER — Other Ambulatory Visit: Payer: Self-pay | Admitting: Medical-Surgical

## 2022-10-14 ENCOUNTER — Other Ambulatory Visit: Payer: Self-pay | Admitting: Medical-Surgical

## 2022-10-14 DIAGNOSIS — G894 Chronic pain syndrome: Secondary | ICD-10-CM

## 2022-10-14 DIAGNOSIS — E039 Hypothyroidism, unspecified: Secondary | ICD-10-CM

## 2022-10-23 ENCOUNTER — Ambulatory Visit
Admission: EM | Admit: 2022-10-23 | Discharge: 2022-10-23 | Disposition: A | Discharge status: Home / Self Care | Payer: Medicaid Other | Attending: Physician Assistant | Admitting: Physician Assistant

## 2022-10-23 ENCOUNTER — Encounter: Payer: Self-pay | Admitting: Emergency Medicine

## 2022-10-23 DIAGNOSIS — J0100 Acute maxillary sinusitis, unspecified: Secondary | ICD-10-CM | POA: Diagnosis not present

## 2022-10-23 MED ORDER — DOXYCYCLINE HYCLATE 100 MG PO CAPS: 100.0000 mg | ORAL_CAPSULE | Freq: Two times a day (BID) | ORAL | 0 refills | Status: DC

## 2022-10-23 MED ORDER — PREDNISONE 20 MG PO TABS: 20.0000 mg | ORAL_TABLET | Freq: Every day | ORAL | 0 refills | Status: AC

## 2022-10-23 NOTE — ED Provider Notes (Signed)
[facility]    CSN: [id number] Arrival date & time: 10/23/22  1244      History   Chief Complaint Chief Complaint  Patient presents with   Facial Pain    HPI Tiffany Best is a 57 y.o. female.   Patient presents today with a 2-week history of sinus symptoms.  She reports congestion, sinus pressure, headache, intermittent otalgia, fatigue, malaise.  She denies any fever, cough, shortness of breath, chest pain, nausea, vomiting.  She has been doing sinus rinses as well as Allegra-D and ibuprofen with minimal improvement of symptoms.  Denies any known sick contacts.  She has had COVID within the past few months; January 2024.  She then developed sinobronchitis and was started on a prednisone burst as well of azithromycin.  She reports complete resolution of the symptoms until recurrence recently.  She does have a history of recurrent sinus infections but denies history of sinus surgery.  She has not had any additional antibiotics or steroids in the past 90 days.  She is concerned that symptoms are related to a sinus infection as her symptoms have worsened despite several weeks of over-the-counter medication.    Past Medical History:  Diagnosis Date   Plantar fasciitis, left 08/02/2017   Thyroid disease    Tibialis posterior tendinitis, left 08/02/2017   Trochanteric bursitis of both hips 08/02/2017    Patient Active Problem List   Diagnosis Date Noted   Spondylolisthesis at L5-S1 level 03/31/2021   Primary osteoarthritis of both knees 03/31/2021   Arthralgia of multiple sites 10/15/2018   Moderate episode of recurrent major depressive disorder (HCC) 10/15/2018   Chronic idiopathic constipation 10/15/2018   At moderate risk for fall 10/15/2018   Left anterior knee pain 10/15/2018   Chronic pain syndrome 10/15/2018   Abnormal weight gain 10/09/2018   Mixed hyperlipidemia 09/19/2017   Irregular periods 09/14/2017   Seasonal allergies 09/14/2017   Encounter for weight  loss counseling 09/14/2017   Class 2 severe obesity due to excess calories with serious comorbidity in adult (HCC) 09/14/2017   Elevated blood pressure reading 09/14/2017   Chronic fatigue 09/14/2017   Perimenopausal 09/14/2017   Allergic conjunctivitis of both eyes 09/14/2017   Seasonal allergic rhinitis 09/14/2017   Cigarette nicotine dependence without complication 09/14/2017   Trochanteric bursitis of both hips 08/02/2017   Plantar fasciitis, left 08/02/2017   Tibialis posterior tendinitis, left 08/02/2017   Obesity (BMI 30-39.9) 08/02/2017   Malaise and fatigue 12/18/2012    Past Surgical History:  Procedure Laterality Date   APPENDECTOMY      OB History     Gravida  2   Para  2   Term      Preterm      AB      Living         SAB      IAB      Ectopic      Multiple      Live Births               Home Medications    Prior to Admission medications   Medication Sig Start Date End Date Taking? Authorizing Provider  doxycycline (VIBRAMYCIN) 100 MG capsule Take 1 capsule (100 mg total) by mouth 2 (two) times daily. 10/23/22  Yes Alveria Mcglaughlin K, PA-C  predniSONE (DELTASONE) 20 MG tablet Take 1 tablet (20 mg total) by mouth daily for 3 days. 10/23/22 10/26/22 Yes Child Campoy K, PA-C  buPROPion (WELLBUTRIN XL) 150  MG 24 hr tablet Take 1 tablet (150 mg total) by mouth every morning. LAST REFILL. NEEDS AN APPT 05/10/22   Christen Butter, NP  celecoxib (CELEBREX) 100 MG capsule TAKE 1 CAP BY MOUTH 2 TIMES DAILY AS NEEDED FOR MODERATE PAIN NEEDS APPOINTMENT FOR FURTHER REFILLS. 09/28/22   Christen Butter, NP  Cholecalciferol (VITAMIN D3) 250 MCG (10000 UT) capsule Take 10,000 Units by mouth daily. Patient not taking: Reported on 10/23/2022    [provider]  Fluocinolone Acetonide Scalp 0.01 % OIL Apply 1 application topically 2 (two) times a week. 01/16/20   Sunnie Nielsen, DO  ketoconazole (NIZORAL) 2 % shampoo Apply 1 application topically 2 (two) times a  week. 11/28/19   Sunnie Nielsen, DO  levothyroxine (SYNTHROID) 100 MCG tablet TAKE 1 TABLET (100 MCG TOTAL) BY MOUTH DAILY. NO REFILLS. NEEDS A THYROID CHECK FOR MED SAFETY 10/14/22   Christen Butter, NP  levothyroxine (SYNTHROID) 125 MCG tablet Take 1 tablet (125 mcg total) by mouth daily before breakfast. 05/09/22   Christen Butter, NP  metroNIDAZOLE (METROGEL) 1 % gel Apply topically daily. For rosacea 03/22/19   Sunnie Nielsen, DO  NONFORMULARY OR COMPOUNDED ITEM [phone] Bath/shower chair, with or without wheels, any size. 11/06/18   Carlis Stable, PA-C  NONFORMULARY OR COMPOUNDED ITEM [phone] Bath tub wall rail 11/06/18   Carlis Stable, PA-C  Plecanatide (TRULANCE) 3 MG TABS Take 1 tablet by mouth daily. 03/17/21   Christen Butter, NP  tetrahydrozoline-zinc (VISINE-AC) 0.05-0.25 % ophthalmic solution 2 drops 3 (three) times daily as needed.    [provider]  vitamin B-12 (CYANOCOBALAMIN) 500 MCG tablet Take 500 mcg by mouth daily.    [provider]    Family History Family History  Family history unknown: Yes    Social History Social History   Tobacco Use   Smoking status: Every Day    Years: 15    Types: Cigarettes   Smokeless tobacco: Never   Tobacco comments:    6-8 cigs daily  Vaping Use   Vaping Use: Never used  Substance Use Topics   Alcohol use: Yes    Alcohol/week: 2.0 - 3.0 standard drinks of alcohol    Types: 2 - 3 Standard drinks or equivalent per week   Drug use: Never     Allergies   Penicillins   Review of Systems Review of Systems  Constitutional:  Positive for activity change and fatigue. Negative for appetite change and fever.  HENT:  Positive for congestion, postnasal drip, sinus pressure, sinus pain and sore throat. Negative for sneezing.   Respiratory:  Negative for cough and shortness of breath.   Cardiovascular:  Negative for chest pain.  Gastrointestinal:  Negative for abdominal pain, diarrhea, nausea and  vomiting.  Neurological:  Positive for headaches. Negative for dizziness and light-headedness.     Physical Exam Triage Vital Signs ED Triage Vitals  Enc Vitals Group     BP 10/23/22 1254 (!) 138/93     Pulse Rate 10/23/22 1254 96     Resp 10/23/22 1254 16     Temp 10/23/22 1254 (!) 97.5 F (36.4 C)     Temp Source 10/23/22 1254 Oral     SpO2 10/23/22 1254 98 %     Weight 10/23/22 1259 198 lb (89.8 kg)     Height 10/23/22 1259 5' (1.524 m)     Head Circumference --      Peak Flow --      Pain Score 10/23/22  1257 6     Pain Loc --      Pain Edu? --      Excl. in GC? --    No data found.  Updated Vital Signs BP (!) 138/93 (BP Location: Left Arm)   Pulse 96   Temp (!) 97.5 F (36.4 C) (Oral)   Resp 16   Ht 5' (1.524 m)   Wt 198 lb (89.8 kg)   LMP 11/11/2016 (Approximate)   SpO2 98%   BMI 38.67 kg/m   Visual Acuity Right Eye Distance:   Left Eye Distance:   Bilateral Distance:    Right Eye Near:   Left Eye Near:    Bilateral Near:     Physical Exam Vitals reviewed.  Constitutional:      General: She is awake. She is not in acute distress.    Appearance: Normal appearance. She is well-developed. She is not ill-appearing.     Comments: Very pleasant female appears stated age in no acute distress sitting comfortably in exam room  HENT:     Head: Normocephalic and atraumatic.     Right Ear: Tympanic membrane, ear canal and external ear normal. Tympanic membrane is not erythematous or bulging.     Left Ear: Tympanic membrane, ear canal and external ear normal. Tympanic membrane is not erythematous or bulging.     Nose:     Right Sinus: Maxillary sinus tenderness present. No frontal sinus tenderness.     Left Sinus: Maxillary sinus tenderness present. No frontal sinus tenderness.     Mouth/Throat:     Pharynx: Uvula midline. Posterior oropharyngeal erythema present. No oropharyngeal exudate.     Comments: Significant erythema and drainage in posterior  oropharynx Cardiovascular:     Rate and Rhythm: Normal rate and regular rhythm.     Heart sounds: Normal heart sounds, S1 normal and S2 normal. No murmur heard. Pulmonary:     Effort: Pulmonary effort is normal.     Breath sounds: Normal breath sounds. No wheezing, rhonchi or rales.     Comments: Clear to auscultation bilaterally Psychiatric:        Behavior: Behavior is cooperative.      UC Treatments / Results  Labs (all labs ordered are listed, but only abnormal results are displayed) Labs Reviewed - No data to display  EKG   Radiology No results found.  Procedures Procedures (including critical care time)  Medications Ordered in UC Medications - No data to display  Initial Impression / Assessment and Plan / UC Course  I have reviewed the triage vital signs and the nursing notes.  Pertinent labs & imaging results that were available during my care of the patient were reviewed by me and considered in my medical decision making (see chart for details).     Patient is well-appearing, afebrile, nontoxic, nontachycardic.  Viral testing was deferred as patient has been symptomatic for several weeks and this would not change management.  Given prolonged and worsening symptoms will cover for secondary bacterial infection with doxycycline 100 mg twice daily for 7 days as patient reports hives with penicillin use.  Discussed that she is to avoid prolonged sun exposure while on this medication due to associated photosensitivity.  She can continue over-the-counter medications as needed including nasal saline and sinus rinses.  Prescription for prednisone burst of 20 mg for 3 days was sent to pharmacy.  We discussed that she should not take NSAIDs with this medication due to risk of GI bleeding.  If  her symptoms or not improving within a week she is to return here or see her primary care.  If she has any worsening symptoms including cough, fever, shortness of breath, nausea/vomiting  interfering with oral intake she needs to be seen immediately.  Strict return precautions given.  Final Clinical Impressions(s) / UC Diagnoses   Final diagnoses:  Acute non-recurrent maxillary sinusitis     Discharge Instructions      I am concerned that you have a sinus infection.  Please start doxycycline 100 mg twice daily for 7 days.  Stay out of the sun while on this medication as it can cause you to have a bad sunburn.  Take prednisone 20 mg for 3 days.  Do not take NSAIDs with this medication including aspirin, ibuprofen/Advil, naproxen/Aleve.  Continue your allergy medication and nasal saline/sinus rinses.  If your symptoms or not improving within a week return for reevaluation.  If anything worsens you should be seen immediately.     ED Prescriptions     Medication Sig Dispense Auth. Provider   doxycycline (VIBRAMYCIN) 100 MG capsule Take 1 capsule (100 mg total) by mouth 2 (two) times daily. 14 capsule Magdaleno Lortie K, PA-C   predniSONE (DELTASONE) 20 MG tablet Take 1 tablet (20 mg total) by mouth daily for 3 days. 3 tablet Gayl Ivanoff, Noberto Retort, PA-C      PDMP not reviewed this encounter.   Jeani Hawking, PA-C 10/23/22 1322

## 2022-10-23 NOTE — ED Triage Notes (Addendum)
Sinus congestion x 2 weeks  Facial pain  Pain to left ear OTC Allegra D q 12 hrs & saline rinses  Ibuprofen 400mg   at 1000 - mod relief

## 2022-10-23 NOTE — Discharge Instructions (Signed)
I am concerned that you have a sinus infection.  Please start doxycycline 100 mg twice daily for 7 days.  Stay out of the sun while on this medication as it can cause you to have a bad sunburn.  Take prednisone 20 mg for 3 days.  Do not take NSAIDs with this medication including aspirin, ibuprofen/Advil, naproxen/Aleve.  Continue your allergy medication and nasal saline/sinus rinses.  If your symptoms or not improving within a week return for reevaluation.  If anything worsens you should be seen immediately.

## 2022-10-27 ENCOUNTER — Encounter: Payer: Self-pay | Admitting: Medical-Surgical

## 2022-10-27 ENCOUNTER — Other Ambulatory Visit: Payer: Self-pay | Admitting: Medical-Surgical

## 2022-10-27 ENCOUNTER — Telehealth: Payer: Self-pay | Admitting: Medical-Surgical

## 2022-10-27 NOTE — Telephone Encounter (Signed)
Pt is also asking for lab order for thyroid check.

## 2022-10-27 NOTE — Telephone Encounter (Signed)
Tiffany Best did not want another appointment. She states the doxycycline is causing daily vomiting. I advised her to call urgent care.

## 2022-10-27 NOTE — Telephone Encounter (Signed)
Pt called. Pt states that she was seen at [facility] on May 12 and was prescribed Doxycyline for sinusitis but she has been throwing up and is nauseous since being on med.  There are no appointments available for today

## 2022-12-22 ENCOUNTER — Telehealth: Payer: Self-pay | Admitting: Medical-Surgical

## 2022-12-22 NOTE — Telephone Encounter (Signed)
Request denied. Patient was last seen in 03/2021. Needs an appointment, as well as labs to determine medication safety and appropriate dosing. Please contact the patient for scheduling. Thanks in advance.

## 2022-12-22 NOTE — Telephone Encounter (Signed)
Patient called to request refills on Levothyroxine 125mg   90 day supply [facility] possible [facility] [address] [phone]

## 2022-12-23 ENCOUNTER — Other Ambulatory Visit: Payer: Self-pay | Admitting: Medical-Surgical

## 2022-12-23 DIAGNOSIS — E039 Hypothyroidism, unspecified: Secondary | ICD-10-CM

## 2023-03-08 DIAGNOSIS — J019 Acute sinusitis, unspecified: Secondary | ICD-10-CM | POA: Diagnosis not present

## 2023-03-08 DIAGNOSIS — J309 Allergic rhinitis, unspecified: Secondary | ICD-10-CM | POA: Diagnosis not present

## 2023-04-03 ENCOUNTER — Other Ambulatory Visit: Payer: Self-pay | Admitting: Medical-Surgical

## 2023-04-13 ENCOUNTER — Telehealth: Payer: Medicaid Other | Admitting: Medical-Surgical

## 2023-04-17 ENCOUNTER — Telehealth: Payer: Self-pay

## 2023-04-17 ENCOUNTER — Telehealth: Payer: Medicaid Other | Admitting: Medical-Surgical

## 2023-04-17 NOTE — Telephone Encounter (Signed)
She is well overdue for an in person visit as it is our office policy to be seen once per year in office. This conversation has been held before and she is aware of the request. Please see the note from 06/22/2022 for clarification and documentation. Also see messages from May and July under encounters.  I am not willing to continue doing virtual visits with her to manage her health needs until she is seen in office for her follow up. Going forward, she will need to be seen once a year in person per office policy.

## 2023-04-17 NOTE — Telephone Encounter (Signed)
Spoke with patient around 8:30 AM. She scheduled a MyChart visit 10/31 and called to reschedule due to a family emergency. When she called 10/31 the person that she spoke with changed her appointment to 11/04. Christen Butter asked that the patient is rescheduled to come into the office for her appointment due to the patient has not been seen in office since 03/17/2021. She had a virtual visit with Christen Butter 06/22/2022.  Joy asked the front to contact the patient this morning to change her visit to an in office appointment. The patient is wanting to know why they didn't tell her that when she called 04/13/2023 to reschedule. She is very upset and is wanting to have a virtual visit then come in for labs.   She repeatedly kept stating that she will contact Toniann Fail. Also stating that she has messages from MyChart to check in for her virtual appointment over the weekend. I stated that it is an automatic generic message for MyChart visits.   I kept offering to schedule her an in office appointment and she feels like I was not understanding her. She feels like we are denying her, her Thyroid medication. I stated that we are not denying her medication, that we just need for her to come in the office for an appointment and have labs done since she hasn't physical seen Christen Butter since 03/17/2021. Last labs for TSH was drawn on 05/05/2022.  I advised the patient that I will further investigate and have someone contact her. She asked if she had an appointment today and I stated that I can schedule her an in office appointment if she would like. She wanted to make sure that I documented our conversation so Toniann Fail will know what happened.

## 2023-04-23 ENCOUNTER — Other Ambulatory Visit: Payer: Self-pay | Admitting: Medical-Surgical

## 2023-04-24 NOTE — Telephone Encounter (Signed)
Upcoming appointment 05/05/2023  Last labs 05/03/2022

## 2023-04-25 NOTE — Telephone Encounter (Signed)
30-day supply sent to get patient to her upcoming follow-up appointment and allow for blood work to be checked.

## 2023-05-05 ENCOUNTER — Encounter: Payer: Self-pay | Admitting: Medical-Surgical

## 2023-05-05 ENCOUNTER — Ambulatory Visit: Payer: Medicaid Other | Admitting: Medical-Surgical

## 2023-05-05 VITALS — BP 151/87 | HR 101 | Resp 20 | Ht 60.0 in | Wt 205.0 lb

## 2023-05-05 DIAGNOSIS — E782 Mixed hyperlipidemia: Secondary | ICD-10-CM | POA: Diagnosis not present

## 2023-05-05 DIAGNOSIS — F331 Major depressive disorder, recurrent, moderate: Secondary | ICD-10-CM | POA: Diagnosis not present

## 2023-05-05 DIAGNOSIS — K5904 Chronic idiopathic constipation: Secondary | ICD-10-CM

## 2023-05-05 DIAGNOSIS — E039 Hypothyroidism, unspecified: Secondary | ICD-10-CM | POA: Diagnosis not present

## 2023-05-05 DIAGNOSIS — R030 Elevated blood-pressure reading, without diagnosis of hypertension: Secondary | ICD-10-CM

## 2023-05-05 DIAGNOSIS — R0981 Nasal congestion: Secondary | ICD-10-CM | POA: Diagnosis not present

## 2023-05-05 LAB — POC COVID19 BINAXNOW: SARS Coronavirus 2 Ag: NEGATIVE

## 2023-05-05 LAB — POCT INFLUENZA A/B
Influenza A, POC: NEGATIVE
Influenza B, POC: NEGATIVE

## 2023-05-05 MED ORDER — TOPIRAMATE 25 MG PO TABS: 25.0000 mg | ORAL_TABLET | Freq: Two times a day (BID) | ORAL | 1 refills | Status: AC

## 2023-05-05 MED ORDER — PROPRANOLOL HCL ER 60 MG PO CP24: 60.0000 mg | ORAL_CAPSULE | Freq: Every day | ORAL | 1 refills | Status: DC

## 2023-05-05 NOTE — Progress Notes (Signed)
        Established patient visit  History, exam, impression, and plan:  1. Hypothyroidism, unspecified type Pleasant 57 year old female presenting today with history of hypothyroidism currently treated with levothyroxine 125 mcg daily.  Tolerating the medication well without side effects.  Denies any concerning symptoms.  Checking TSH.  Continue levothyroxine, may need to titrate dose depending on results. - TSH  2. Moderate episode of recurrent major depressive disorder (HCC) Continues to have significant issues with anxiety and depression.  Not currently on any mood management medications and is not interested in counseling.  Has a lot of outside stressors contributing to her own daily concerns.  Feels that she wakes in the morning and her anxiety is much worse then gradually improves throughout the day.  Exhibits elevations in blood pressure as well as heart rate when she is feeling significantly anxious.  After much discussion, decided to start her on propranolol which can help with overall anxiety levels.  3. Mixed hyperlipidemia History of hyperlipidemia not currently treated with medications.  Admits to room for improvement in her dietary habits as well as very limited exercise.  Checking labs as below.  May need to discuss medication management depending on results. - CMP14+EGFR - Lipid panel  4. Chronic idiopathic constipation Previously prescribed Trulance but is not currently taking this.  Has found that if she limits her intake of carbohydrates, she does not have so many GI issues and is able to have regular bowel movements.  No need for medical intervention today.  5. Elevated blood pressure reading Blood pressure remains elevated.  Recheck after arrival also elevated.  Reports this is related to use of cold medicine last night and anxiety.  Heart rate also elevated.  She does meet the criteria for diagnosing hypertension but has contributing factors.  Checking labs as below.   After much discussion, she has multiple factors including tachycardia, elevated blood pressure, and anxiety.  Starting propranolol LA 60 mg daily to be taken at night.  Plan to follow-up in few weeks to see how she is doing with this. - CBC with Differential/Platelet - CMP14+EGFR - Lipid panel  6. Morbid obesity (HCC) Discussed recommendations for weight loss.  Long discussion regarding dietary intake, portion control, and adding regular intentional exercise into her daily routine.  Reviewed various options for help with weight loss and appetite management.  Unlikely to qualify for injectables.  Phentermine is contraindicated with elevated blood pressure.  Has had Wellbutrin in the past and not interested in retrying this.  Starting Topamax 25 mg twice daily and plan to follow-up in a few weeks to see how things are going.  7. Nasal congestion Has had some new onset sinus congestion and postnasal drip.  Took cold medicine last night which seemed to help a little but still not feeling great.  Has been exposed recently to viral symptoms with family members.  POCT flu and COVID-negative today.  Continue symptomatic treatment for viral URI.  Advised to avoid over-the-counter cold and flu medications unless they are made for patients with hypertension/high blood pressure. - POCT Influenza A/B - POC COVID-19  Procedures performed this visit: None.  Return in about 6 weeks (around 06/16/2023) for anxiety/weight follow up.  __________________________________ Thayer Ohm, DNP, APRN, FNP-BC Primary Care and Sports Medicine [facility] [place]

## 2023-05-05 NOTE — Patient Instructions (Addendum)
Start Propranolol ER 60mg  daily. Take at night.  This medication is to help with lowering your heart rate and blood pressure and helping with anxiety.  Start Topamax 25 mg daily for 1 week then increase to 25 mg twice daily.  This is to be used for weight loss efforts.  Please work on increasing regular intentional exercise.  For your viral symptoms, please use Coricidin daytime/nighttime or NyQuil/DayQuil that is made for patients with high blood pressure.  This should be listed on the label of the medication.  We are checking labs today and once your results are back, we will send in refills of the levothyroxine as long as things are stable.  If we need to make a dose adjustment, I will send in the new medication and we will plan to recheck your labs in 6-8 weeks.  Please keep an eye out for a message on MyChart from me regarding your results.

## 2023-05-06 LAB — CMP14+EGFR
ALT: 38 [IU]/L — ABNORMAL HIGH (ref 0–32)
AST: 22 [IU]/L (ref 0–40)
Albumin: 4.9 g/dL (ref 3.8–4.9)
Alkaline Phosphatase: 111 [IU]/L (ref 44–121)
BUN/Creatinine Ratio: 21 (ref 9–23)
BUN: 13 mg/dL (ref 6–24)
Bilirubin Total: 0.3 mg/dL (ref 0.0–1.2)
CO2: 21 mmol/L (ref 20–29)
Calcium: 10.7 mg/dL — ABNORMAL HIGH (ref 8.7–10.2)
Chloride: 99 mmol/L (ref 96–106)
Creatinine, Ser: 0.63 mg/dL (ref 0.57–1.00)
Globulin, Total: 2.9 g/dL (ref 1.5–4.5)
Glucose: 119 mg/dL — ABNORMAL HIGH (ref 70–99)
Potassium: 5.2 mmol/L (ref 3.5–5.2)
Sodium: 141 mmol/L (ref 134–144)
Total Protein: 7.8 g/dL (ref 6.0–8.5)
eGFR: 105 mL/min/{1.73_m2} (ref >59)

## 2023-05-06 LAB — LIPID PANEL
Chol/HDL Ratio: 3.5 ratio (ref 0.0–4.4)
Cholesterol, Total: 291 mg/dL — ABNORMAL HIGH (ref 100–199)
HDL: 83 mg/dL (ref >39)
LDL Chol Calc (NIH): 178 mg/dL — ABNORMAL HIGH (ref 0–99)
Triglycerides: 166 mg/dL — ABNORMAL HIGH (ref 0–149)
VLDL Cholesterol Cal: 30 mg/dL (ref 5–40)

## 2023-05-06 LAB — CBC WITH DIFFERENTIAL/PLATELET
Basophils Absolute: 0.1 10*3/uL (ref 0.0–0.2)
Basos: 1 %
EOS (ABSOLUTE): 0.1 10*3/uL (ref 0.0–0.4)
Eos: 1 %
Hematocrit: 45.2 % (ref 34.0–46.6)
Hemoglobin: 14.9 g/dL (ref 11.1–15.9)
Immature Grans (Abs): 0.1 10*3/uL (ref 0.0–0.1)
Immature Granulocytes: 1 %
Lymphocytes Absolute: 1.7 10*3/uL (ref 0.7–3.1)
Lymphs: 18 %
MCH: 32 pg (ref 26.6–33.0)
MCHC: 33 g/dL (ref 31.5–35.7)
MCV: 97 fL (ref 79–97)
Monocytes Absolute: 0.7 10*3/uL (ref 0.1–0.9)
Monocytes: 7 %
Neutrophils Absolute: 7 10*3/uL (ref 1.4–7.0)
Neutrophils: 72 %
Platelets: 478 10*3/uL — ABNORMAL HIGH (ref 150–450)
RBC: 4.65 x10E6/uL (ref 3.77–5.28)
RDW: 13.2 % (ref 11.7–15.4)
WBC: 9.6 10*3/uL (ref 3.4–10.8)

## 2023-05-06 LAB — TSH: TSH: 3.59 u[IU]/mL (ref 0.450–4.500)

## 2023-05-07 ENCOUNTER — Other Ambulatory Visit: Payer: Self-pay | Admitting: Medical-Surgical

## 2023-05-07 MED ORDER — LEVOTHYROXINE SODIUM 125 MCG PO TABS: ORAL_TABLET | ORAL | 3 refills | Status: DC

## 2023-05-08 ENCOUNTER — Encounter: Payer: Self-pay | Admitting: Medical-Surgical

## 2023-05-09 ENCOUNTER — Other Ambulatory Visit: Payer: Self-pay | Admitting: Family Medicine

## 2023-05-09 MED ORDER — ATORVASTATIN CALCIUM 40 MG PO TABS: 40.0000 mg | ORAL_TABLET | Freq: Every day | ORAL | 3 refills | Status: AC

## 2023-05-16 ENCOUNTER — Telehealth: Payer: Self-pay

## 2023-05-16 DIAGNOSIS — J329 Chronic sinusitis, unspecified: Secondary | ICD-10-CM

## 2023-05-16 MED ORDER — AZITHROMYCIN 250 MG PO TABS: ORAL_TABLET | ORAL | 0 refills | Status: AC

## 2023-05-16 NOTE — Telephone Encounter (Signed)
Z-Pak sent to pharmacy for treatment of continued symptoms.  At last visit, no medication was prescribed for control of her symptoms.  She was instructed to use over-the-counter cold and flu preparations for symptomatic treatment.  ___________________________________________ Thayer Ohm, DNP, APRN, FNP-BC Primary Care and Sports Medicine [facility] [place]

## 2023-05-16 NOTE — Telephone Encounter (Signed)
Copied from CRM [id number]. Topic: Clinical - Lab/Test Results >> May 16, 2023 11:04 AM Alvino Blood C wrote: Reason for CRM:   Pt called wanting to speak with Dr. Diona Foley nurse regarding her labs.

## 2023-05-16 NOTE — Telephone Encounter (Signed)
Copied from CRM [id number]. Topic: Clinical - Medical Advice >> May 15, 2023  1:31 PM Dennison Nancy wrote: Reason for CRM:  Patient seen Joy on 05/08/23 thanksgiving went to see regarding pressure behind earaches, headaches , fatigue , cough, sneezing,mucus ,  request if Christen Butter can prescribe an antibotic or something else , the medication that was prescribe before at last visit is not helping , patient has same symptoms and not getting better  patient stated she feels worse . Agent offer to set up another appointment patient refuse since it is the same symptoms and was last week   , call back #  [phone]

## 2023-05-17 NOTE — Telephone Encounter (Signed)
Attempted to contact the patient. No answer, left a brief msg to return a call back to the clinic regarding the provider's recommendation.

## 2023-05-23 ENCOUNTER — Telehealth: Payer: Self-pay

## 2023-05-23 NOTE — Telephone Encounter (Unsigned)
Copied from CRM [id number]. Topic: Clinical - Medication Refill >> May 23, 2023  8:22 AM Theodis Sato wrote: Reason for CRM: PT states that the antibiotics Tiffany Best prescribed her have not worked for she is still very sick (PT is requesting something for her ear as well as it is painfully swollen)- [facility] a [phone] [place] [place] - [address] [place] [address] Phone: [phone] Fax: [phone]

## 2023-05-23 NOTE — Telephone Encounter (Signed)
Spoke with patient.  Attempted to schedule appt but could not come in during our office hours.  She will possibly be seen at urgent care - patient given this contact number.

## 2023-05-23 NOTE — Telephone Encounter (Signed)
Patient still not feeling well. Attempted to schedule appt but patient could not arrange transportation during our office hours. Gave number to urgent care - she states she may reach out to them to be seen.

## 2023-05-25 ENCOUNTER — Ambulatory Visit: Payer: Medicaid Other | Admitting: Medical-Surgical

## 2023-05-27 ENCOUNTER — Other Ambulatory Visit: Payer: Self-pay | Admitting: Medical-Surgical

## 2023-06-16 DIAGNOSIS — J019 Acute sinusitis, unspecified: Secondary | ICD-10-CM | POA: Diagnosis not present

## 2023-07-04 DIAGNOSIS — J069 Acute upper respiratory infection, unspecified: Secondary | ICD-10-CM | POA: Diagnosis not present

## 2023-07-28 DIAGNOSIS — J019 Acute sinusitis, unspecified: Secondary | ICD-10-CM | POA: Diagnosis not present

## 2023-07-31 DIAGNOSIS — J019 Acute sinusitis, unspecified: Secondary | ICD-10-CM | POA: Diagnosis not present

## 2023-10-25 ENCOUNTER — Other Ambulatory Visit: Payer: Self-pay | Admitting: Medical-Surgical

## 2023-10-27 ENCOUNTER — Other Ambulatory Visit: Payer: Self-pay | Admitting: Medical-Surgical

## 2024-02-13 ENCOUNTER — Encounter: Payer: Self-pay | Admitting: Sports Medicine

## 2024-03-08 DIAGNOSIS — J069 Acute upper respiratory infection, unspecified: Secondary | ICD-10-CM | POA: Diagnosis not present

## 2024-03-18 DIAGNOSIS — J0110 Acute frontal sinusitis, unspecified: Secondary | ICD-10-CM | POA: Diagnosis not present

## 2024-04-23 DIAGNOSIS — J019 Acute sinusitis, unspecified: Secondary | ICD-10-CM | POA: Diagnosis not present

## 2024-04-29 DIAGNOSIS — J209 Acute bronchitis, unspecified: Secondary | ICD-10-CM | POA: Diagnosis not present

## 2024-04-29 DIAGNOSIS — J019 Acute sinusitis, unspecified: Secondary | ICD-10-CM | POA: Diagnosis not present

## 2024-05-07 ENCOUNTER — Ambulatory Visit: Admitting: Medical-Surgical

## 2024-05-22 ENCOUNTER — Other Ambulatory Visit: Payer: Self-pay | Admitting: Medical-Surgical

## 2024-05-31 ENCOUNTER — Ambulatory Visit: Admitting: Medical-Surgical

## 2024-06-18 ENCOUNTER — Other Ambulatory Visit: Payer: Self-pay | Admitting: Medical-Surgical

## 2024-07-02 ENCOUNTER — Ambulatory Visit: Admitting: Medical-Surgical

## 2024-07-05 MED ORDER — LEVOTHYROXINE SODIUM 125 MCG PO TABS: ORAL_TABLET | ORAL | 0 refills | Status: AC

## 2024-07-05 NOTE — Telephone Encounter (Signed)
 Spoke with the patient who states she had a VV scheduled with Joy 07/01/24  that got cancelled due to Joy being out sick. Due to the mixup being told by [facility] that they would send her in a 15 day supply.I did advise her that I would send a 30 day supply, but that she really needs to schedule a chronic issue OV.  Patient states she has covid and would like to have about 2 weeks to get over covid and she will call us  back to schedule. She did say that she would like lab orders to be put in for her to stop by to have drawn when she has a ride.

## 2024-07-05 NOTE — Telephone Encounter (Signed)
 Copied from CRM [id number]. Topic: Clinical - Prescription Issue >> Jul 03, 2024 10:11 AM Tiffany Best wrote: Reason for CRM: Patient has been off her medication since 1/15 and had a virtual visit yesterday cancelled and was told by nurse yesterday that Nurse Willo would call in 15 pills of her  levothyroxine  (SYNTHROID ) 125 MCG tablet until she can come in please give patient a call to follow up on that she hasn't heard from pharmacy >> Jul 05, 2024  8:37 AM Tiffany Best wrote: Patient is following up. States nurse told her joy would call in 15 pills until she can come in. She currently has covid. Please call pt before EOD.
